# Patient Record
Sex: Female | Born: 1945
Health system: Southern US, Community
[De-identification: ages and names within clinical notes are randomized; demographics above are authoritative.]

## PROBLEM LIST (undated history)

## (undated) DIAGNOSIS — Z972 Presence of dental prosthetic device (complete) (partial): Secondary | ICD-10-CM

## (undated) DIAGNOSIS — R52 Pain, unspecified: Secondary | ICD-10-CM

## (undated) DIAGNOSIS — C801 Malignant (primary) neoplasm, unspecified: Secondary | ICD-10-CM

## (undated) DIAGNOSIS — I10 Essential (primary) hypertension: Secondary | ICD-10-CM

## (undated) DIAGNOSIS — M1990 Unspecified osteoarthritis, unspecified site: Secondary | ICD-10-CM

## (undated) DIAGNOSIS — F32A Depression, unspecified: Secondary | ICD-10-CM

## (undated) DIAGNOSIS — R7303 Prediabetes: Secondary | ICD-10-CM

## (undated) DIAGNOSIS — I739 Peripheral vascular disease, unspecified: Secondary | ICD-10-CM

## (undated) DIAGNOSIS — F329 Major depressive disorder, single episode, unspecified: Secondary | ICD-10-CM

## (undated) DIAGNOSIS — E785 Hyperlipidemia, unspecified: Secondary | ICD-10-CM

## (undated) DIAGNOSIS — E119 Type 2 diabetes mellitus without complications: Secondary | ICD-10-CM

## (undated) HISTORY — PX: SKIN CANCER EXCISION: SHX779

## (undated) HISTORY — PX: BREAST CYST ASPIRATION: SHX578

## (undated) HISTORY — DX: Type 2 diabetes mellitus without complications: E11.9

## (undated) HISTORY — DX: Major depressive disorder, single episode, unspecified: F32.9

## (undated) HISTORY — DX: Pain, unspecified: R52

## (undated) HISTORY — DX: Malignant (primary) neoplasm, unspecified: C80.1

## (undated) HISTORY — PX: FRACTURE SURGERY: SHX138

## (undated) HISTORY — DX: Hyperlipidemia, unspecified: E78.5

## (undated) HISTORY — DX: Essential (primary) hypertension: I10

## (undated) HISTORY — DX: Depression, unspecified: F32.A

---

## 2009-07-22 ENCOUNTER — Other Ambulatory Visit: Payer: Self-pay | Admitting: Family Medicine

## 2009-07-25 ENCOUNTER — Ambulatory Visit: Payer: Self-pay | Admitting: Family Medicine

## 2010-09-16 ENCOUNTER — Ambulatory Visit: Payer: Self-pay | Admitting: Family Medicine

## 2010-11-20 ENCOUNTER — Other Ambulatory Visit: Payer: Self-pay | Admitting: Family Medicine

## 2011-02-13 ENCOUNTER — Ambulatory Visit: Payer: Self-pay | Admitting: Unknown Physician Specialty

## 2011-02-16 LAB — PATHOLOGY REPORT

## 2011-10-14 ENCOUNTER — Ambulatory Visit: Payer: Self-pay | Admitting: Family Medicine

## 2012-03-07 ENCOUNTER — Ambulatory Visit: Payer: Self-pay | Admitting: General Practice

## 2012-05-31 ENCOUNTER — Ambulatory Visit: Payer: Self-pay | Admitting: General Practice

## 2012-08-02 HISTORY — PX: LASER ABLATION: SHX1947

## 2012-08-16 ENCOUNTER — Encounter: Payer: Self-pay | Admitting: Nurse Practitioner

## 2012-08-16 ENCOUNTER — Encounter: Payer: Self-pay | Admitting: Cardiothoracic Surgery

## 2012-09-02 ENCOUNTER — Encounter: Payer: Self-pay | Admitting: Cardiothoracic Surgery

## 2012-09-02 ENCOUNTER — Encounter: Payer: Self-pay | Admitting: Nurse Practitioner

## 2012-10-02 ENCOUNTER — Encounter: Payer: Self-pay | Admitting: Cardiothoracic Surgery

## 2012-10-02 ENCOUNTER — Encounter: Payer: Self-pay | Admitting: Nurse Practitioner

## 2012-11-02 DIAGNOSIS — E785 Hyperlipidemia, unspecified: Secondary | ICD-10-CM

## 2012-11-02 HISTORY — DX: Hyperlipidemia, unspecified: E78.5

## 2012-11-04 ENCOUNTER — Ambulatory Visit: Payer: Self-pay | Admitting: Family Medicine

## 2012-12-26 ENCOUNTER — Other Ambulatory Visit: Payer: Self-pay | Admitting: Family Medicine

## 2012-12-26 LAB — COMPREHENSIVE METABOLIC PANEL
Alkaline Phosphatase: 65 U/L (ref 50–136)
Anion Gap: 6 — ABNORMAL LOW (ref 7–16)
Bilirubin,Total: 0.8 mg/dL (ref 0.2–1.0)
Calcium, Total: 8.7 mg/dL (ref 8.5–10.1)
Creatinine: 0.62 mg/dL (ref 0.60–1.30)
EGFR (African American): >60
EGFR (Non-African Amer.): >60
Glucose: 115 mg/dL — ABNORMAL HIGH (ref 65–99)
Potassium: 3.6 mmol/L (ref 3.5–5.1)
SGPT (ALT): 10 U/L — ABNORMAL LOW (ref 12–78)
Total Protein: 7.5 g/dL (ref 6.4–8.2)

## 2012-12-26 LAB — CBC WITH DIFFERENTIAL/PLATELET
HCT: 38.4 % (ref 35.0–47.0)
Lymphocyte #: 1.5 10*3/uL (ref 1.0–3.6)
Lymphocyte %: 27.5 %
MCHC: 33.2 g/dL (ref 32.0–36.0)
Neutrophil #: 3.4 10*3/uL (ref 1.4–6.5)
RBC: 4.26 10*6/uL (ref 3.80–5.20)
RDW: 14.9 % — ABNORMAL HIGH (ref 11.5–14.5)
WBC: 5.6 10*3/uL (ref 3.6–11.0)

## 2012-12-26 LAB — URINALYSIS, COMPLETE
Bacteria: NONE SEEN
Bilirubin,UR: NEGATIVE
Blood: NEGATIVE
Glucose,UR: NEGATIVE mg/dL (ref 0–75)
Ketone: NEGATIVE
Nitrite: NEGATIVE
Ph: 6 (ref 4.5–8.0)
Protein: NEGATIVE
RBC,UR: 1 /HPF (ref 0–5)
Specific Gravity: 1.018 (ref 1.003–1.030)
WBC UR: 1 /HPF (ref 0–5)

## 2013-06-01 ENCOUNTER — Ambulatory Visit: Payer: Self-pay | Admitting: Family Medicine

## 2013-06-02 ENCOUNTER — Ambulatory Visit: Payer: Self-pay | Admitting: Family Medicine

## 2013-07-03 ENCOUNTER — Ambulatory Visit: Payer: Self-pay | Admitting: Family Medicine

## 2013-08-02 ENCOUNTER — Ambulatory Visit: Payer: Self-pay | Admitting: Family Medicine

## 2013-08-07 ENCOUNTER — Other Ambulatory Visit: Payer: Self-pay | Admitting: Family Medicine

## 2013-11-09 ENCOUNTER — Ambulatory Visit: Payer: Self-pay | Admitting: Family Medicine

## 2013-11-20 ENCOUNTER — Ambulatory Visit: Payer: Self-pay | Admitting: Family Medicine

## 2013-11-24 ENCOUNTER — Ambulatory Visit: Payer: Self-pay | Admitting: Family Medicine

## 2013-11-27 LAB — PATHOLOGY REPORT

## 2014-04-06 ENCOUNTER — Ambulatory Visit: Payer: Self-pay | Admitting: Unknown Physician Specialty

## 2014-04-11 LAB — PATHOLOGY REPORT

## 2014-05-18 DIAGNOSIS — M179 Osteoarthritis of knee, unspecified: Secondary | ICD-10-CM | POA: Insufficient documentation

## 2014-05-18 DIAGNOSIS — M1710 Unilateral primary osteoarthritis, unspecified knee: Secondary | ICD-10-CM | POA: Insufficient documentation

## 2014-11-20 ENCOUNTER — Ambulatory Visit: Payer: Self-pay | Admitting: Family Medicine

## 2015-07-17 ENCOUNTER — Other Ambulatory Visit: Payer: Self-pay

## 2015-07-17 ENCOUNTER — Ambulatory Visit: Payer: Self-pay

## 2015-07-17 NOTE — Patient Outreach (Signed)
[facility]) Care Management  07/17/2015  Kendra Downs June 16, 1946 [id number]  [name] did not show for her scheduled appointment.  I will call her to reschedule.   Gentry Fitz, RN, BA, [place], [place] Direct Dial:  [phone]  Fax:  [phone] E-mail: [email or web address] [address] [place], [place]  [address]

## 2015-07-17 NOTE — Patient Outreach (Signed)
[facility]) Care Management  07/17/2015  Ainsleigh Kakos [birth year] [id number]  I called to reschedule Sherian's visit that she missed today.  Left a message for her to return my call.   Gentry Fitz, RN, BA, [place], [place] Direct Dial:  [phone]  Fax:  [phone] E-mail: [email or web address] [address] [place], [place]  [address]

## 2015-08-12 ENCOUNTER — Other Ambulatory Visit: Payer: Self-pay

## 2015-08-12 VITALS — BP 142/72 | Ht 65.0 in | Wt 210.5 lb

## 2015-08-12 DIAGNOSIS — E119 Type 2 diabetes mellitus without complications: Secondary | ICD-10-CM

## 2015-08-12 NOTE — Patient Outreach (Signed)
[facility]) Care Management  [place]  08/12/2015   Kendra Downs [birth year] [id number]  Subjective: Patient in for her regularly scheduled Link to Wellness visit.  She spent over an hour discussing recent loses in her family (her dog, her brother, a sister in law).  She also discussed her feeling of loss when her daughter in law revealed that she has begun to date (Xin's only son was married to her- he died in Feb 07, 2012).  She has been waking up through the middle of the night in tears and was crying in my office.  She stopped taking her Celexa in early 2015-02-07 when she was feeling better but we discussed her starting it again.  Verbalizes being raised to "grin and bear it" , to not show weakness, and to avoid talking about feeling depressed. She reports having a cousin who she can talk to when she's feeling down.    Objective: Weeping through much of our appointment discussing her son. Brought in her blood sugar records which have crept up from 104-139 mg/dl fasting in March to 120-158 mg/dl fasting now- she's checking sugars less often.  She is not exercising.  She has gained 10lbs since March, 2016.  Current Medications:  Current Outpatient Prescriptions  Medication Sig Dispense Refill  . etodolac (LODINE) 500 MG tablet Take by mouth.    Marland Kitchen glucose blood (ONE TOUCH ULTRA TEST) test strip Use 1 strip via meter once a day as directed    . hydrochlorothiazide (MICROZIDE) 12.5 MG capsule TAKE ONE CAPSULE BY MOUTH DAILY    . quinapril-hydrochlorothiazide (ACCURETIC) 20-12.5 MG tablet Take by mouth.    . Vitamin D, Ergocalciferol, (DRISDOL) 50000 UNITS CAPS capsule TAKE 1 CAPSULE BY MOUTH ONCE A WEEK     No current facility-administered medications for this visit.    Functional Status:  In your present state of health, do you have any difficulty performing the following activities: 08/12/2015  Hearing? N  Vision? N  Difficulty concentrating or making  decisions? N  Walking or climbing stairs? N  Dressing or bathing? N  Doing errands, shopping? N  Preparing Food and eating ? N  Using the Toilet? N   Assessment: A1C remains ideal (6.2%)  but blood sugars continue to climb.   Reports feeling down and depressed most days of the week.  No falls in the past year.  Plan: She still has Celexa at home but would need refills if she decides- I have asked her to call you if she decides she wants to restart it.   THN CM Care Plan Problem One        Most Recent Value   Care Plan Problem One  Potential for elevated blood sugars   Role Documenting the Problem One  Care Management Coordinator   Care Plan for Problem One  Active   THN Long Term Goal (31-90 days)  Patient will maintain an A1C of less than 6.5%   THN Long Term Goal Start Date  08/12/15   Interventions for Problem One Long Term Goal  1. Continue to check blood sugars most days of the week- document   2. limit meals to 1 plate and follow the plate method for chosing a meal   3. consider calling hospice about the support group for peopl e who have lost their children  4. Exercise 3x/week for 15 minutes  5. talk to MD about restarting Celexa 6. Get a dilated eye exam     I  will follow up with Lanore in 3 months.  Please consider bringing her into your office or calling her at home to discuss her depression/ starting Celexa again.      Gentry Fitz, RN, BA, [place], [place] Direct Dial:  [phone]  Fax:  [phone] E-mail: [email or web address] [address], [place], [place]  [address]

## 2015-08-21 ENCOUNTER — Other Ambulatory Visit: Payer: Self-pay

## 2015-08-21 NOTE — Patient Outreach (Signed)
[facility]) Care Management  08/21/2015  Kendra Downs [birth year] [id number]   Updated medical chart via phone today with [name].  She's feeling well; less depressed- spent the weekend with her cousin discussing recent visit with me and her feelings of loss.  Her cousin recently lost her partner and can empathize with [name].  She sounds very supportive.   Follow up in January, 2017   Gentry Fitz, RN, [place], [place], [place]:  [phone]  Fax:  [phone] E-mail: [email or web address] [address] [place], [place]  [address]

## 2015-11-13 ENCOUNTER — Ambulatory Visit: Payer: 59

## 2015-11-27 ENCOUNTER — Other Ambulatory Visit: Payer: Self-pay

## 2015-11-27 VITALS — BP 140/70 | Ht 65.0 in | Wt 209.9 lb

## 2015-11-27 DIAGNOSIS — E119 Type 2 diabetes mellitus without complications: Secondary | ICD-10-CM

## 2015-11-27 NOTE — Patient Outreach (Signed)
Kendra Downs [facility]) Care Management  [place]  11/27/2015   Kendra Downs 1946-09-08 [id number]  Subjective: Patient in for Link to Wellness diabetes visit. She spoke briefly today about her son, her daughter in law but denies feeling down or depressed- she did not reach out to the MD to get Celexa restarted despite our long discussion and her emotional distress at our last visit.  She talked openly about her daughter in law dating.  She has been checking blood sugars most days and her average morning blood sugars are 97-136mg /dl.  She has maintained her weight over the holidays.  Objective:  Filed Vitals:   11/27/15 1501  BP: 140/70  Height: 1.651 m (5\' 5" )  Weight: 209 lb 14.4 oz (95.21 kg)     Current Medications:  Current Outpatient Prescriptions  Medication Sig Dispense Refill  . etodolac (LODINE) 500 MG tablet Take by mouth.    . hydrochlorothiazide (MICROZIDE) 12.5 MG capsule TAKE ONE CAPSULE BY MOUTH DAILY    . quinapril-hydrochlorothiazide (ACCURETIC) 20-12.5 MG tablet Take by mouth.    . Vitamin D, Ergocalciferol, (DRISDOL) 50000 UNITS CAPS capsule TAKE 1 CAPSULE BY MOUTH ONCE A WEEK    . glucose blood (ONE TOUCH ULTRA TEST) test strip Reported on 11/27/2015     No current facility-administered medications for this visit.    Functional Status:  In your present state of health, do you have any difficulty performing the following activities: 11/27/2015 08/12/2015  Hearing? N N  Vision? N N  Difficulty concentrating or making decisions? N N  Walking or climbing stairs? Y N  Dressing or bathing? N N  Doing errands, shopping? N N  Preparing Food and eating ? - N  Using the Toilet? - N    Fall/Depression Screening: PHQ 2/9 Scores 11/27/2015 08/21/2015 08/12/2015  PHQ - 2 Score 0 4 4  PHQ- 9 Score - 6 6    Assessment: Kendra Downs appears to be doing better emotionally (not crying during our appointment) but tells me she's had some difficulty at  work getting frustrated with some of the hospital policies/employees.  She feeling optimistic about starting 1st shift and thinks she'll be able to start exercising if she does change shifts.  She did not call Hospice for a support group, did not start exercising and did not call MD to discuss Celexa despite setting these goals at our last visit.   Plan: Kendra Downs plans to join the [facility] gym and exercise 2x/week starting with 15 minutes each time.  THN CM Care Plan Problem One        Most Recent Value   Care Plan Problem One  Potential for elevated blood sugars   Role Documenting the Problem One  Care Management Coordinator   Care Plan for Problem One  Active   THN Long Term Goal (31-90 days)  Patient will maintain an A1C of less than 6.5%   THN Long Term Goal Start Date  08/12/15   Interventions for Problem One Long Term Goal  1. Continue to check blood sugars most days of the week- document   2. limit meals to 1 plate and follow the plate method for chosing a meal        The plan is to see Kendra Downs in 6 months.   Gentry Fitz, RN, BA, [place], [place] Direct Dial:  [phone]  Fax:  [phone] E-mail: [email or web address] [address] [place], [place]  [address]

## 2015-12-31 ENCOUNTER — Other Ambulatory Visit: Payer: Self-pay | Admitting: Family Medicine

## 2015-12-31 DIAGNOSIS — N6310 Unspecified lump in the right breast, unspecified quadrant: Secondary | ICD-10-CM

## 2016-01-02 ENCOUNTER — Ambulatory Visit: Payer: 59 | Attending: Family Medicine

## 2016-01-02 DIAGNOSIS — M25511 Pain in right shoulder: Secondary | ICD-10-CM

## 2016-01-02 NOTE — Therapy (Signed)
[place] MAIN [facility] SERVICES [address] [place], [place], [address] Phone: [phone]   Fax:  [phone]  Physical Therapy Treatment  Patient Details  Name: Kendra Downs MRN: [id number] Date of Birth: Mar 30, 1946 No Data Recorded  Encounter Date: 01/02/2016    Past Medical History  Diagnosis Date  . Cancer ([place])     right arm - skin  . Hypertension   . Depression   . Diabetes mellitus without complication ([place])   . Pain     right knee  . Hyperlipidemia 2014    Past Surgical History  Procedure Laterality Date  . Laser ablation  october 2013    venous ulcer    There were no vitals filed for this visit.  Visit Diagnosis:  Pain in joint of right shoulder        PT/OT/SLP Screening Form   Time: [id number]______     Time out_____   Complaint _L shoulder pain__________________ Past Medical Hx:  ___HTN, OA _____________ Injury Date:__n/a______________________________  Pain Scale: __ __________ Patient's phone number:   Hx (this occurrence):  Pt reports 1.5 weeks having R shoulder pain especially when shes sleeping. Pt reports mild R pain at rest and more pain when moving the arm. Pt denies any neck pain or numbness tingling. Pt denies any injury or falls. Pt works in [employer] as a Scientist, water quality primarily. Pt has taken some NSAIDs.     Assessment: Pt is tender over RTC insertion, R biceps tendon and infraspinatus muscle belly.  + painful arch + H-K test Pt has full ROM but painful  Cervical screen negative   Recommendations:   Ice, limit overhead activity , posture correction  Comments:    []  Patient would benefit from an MD referral [x]  Patient would benefit from a full PT/OT/ SLP evaluation and treatment. []  No intervention recommended at this time.    [place] Harms. Marley Pakula, PT, DPT [phone]                                     Problem List Patient Active Problem  List   Diagnosis Date Noted  . Diabetes type 2, controlled ([place]) 08/12/2015  . Arthritis of knee, degenerative 05/18/2014   [place] Harms. Loraine Bhullar, PT, DPT [phone]  Chazz Philson 01/02/2016, 2:38 PM  [place] MAIN [facility] SERVICES [address] [place], [place], [address] Phone: [phone]   Fax:  [phone]  Name: Kendra Downs MRN: [id number] Date of Birth: 06-28-1946

## 2016-01-08 DIAGNOSIS — M25511 Pain in right shoulder: Secondary | ICD-10-CM | POA: Diagnosis not present

## 2016-01-08 DIAGNOSIS — M12811 Other specific arthropathies, not elsewhere classified, right shoulder: Secondary | ICD-10-CM | POA: Diagnosis not present

## 2016-01-08 DIAGNOSIS — M19011 Primary osteoarthritis, right shoulder: Secondary | ICD-10-CM | POA: Diagnosis not present

## 2016-01-10 ENCOUNTER — Other Ambulatory Visit: Payer: 59

## 2016-01-10 ENCOUNTER — Ambulatory Visit: Payer: 59

## 2016-01-15 ENCOUNTER — Ambulatory Visit
Admission: RE | Admit: 2016-01-15 | Discharge: 2016-01-15 | Disposition: A | Discharge status: Home / Self Care | Payer: 59 | Source: Ambulatory Visit | Attending: Family Medicine | Admitting: Family Medicine

## 2016-01-15 ENCOUNTER — Other Ambulatory Visit: Payer: Self-pay | Admitting: Family Medicine

## 2016-01-15 DIAGNOSIS — N6310 Unspecified lump in the right breast, unspecified quadrant: Secondary | ICD-10-CM

## 2016-01-15 DIAGNOSIS — N63 Unspecified lump in breast: Secondary | ICD-10-CM | POA: Insufficient documentation

## 2016-01-24 ENCOUNTER — Encounter: Payer: Self-pay | Admitting: Physician Assistant

## 2016-01-24 ENCOUNTER — Ambulatory Visit: Payer: Self-pay | Admitting: Physician Assistant

## 2016-01-24 VITALS — BP 140/80 | HR 78 | Temp 97.9°F

## 2016-01-24 DIAGNOSIS — J209 Acute bronchitis, unspecified: Secondary | ICD-10-CM

## 2016-01-24 MED ORDER — PSEUDOEPH-BROMPHEN-DM 30-2-10 MG/5ML PO SYRP: 5.0000 mL | ORAL_SOLUTION | Freq: Four times a day (QID) | ORAL | Status: DC | PRN

## 2016-01-24 MED ORDER — AZITHROMYCIN 250 MG PO TABS: ORAL_TABLET | ORAL | Status: DC

## 2016-01-24 NOTE — Progress Notes (Signed)
   Subjective:cough/congestion    Patient ID: Kendra Downs, female    DOB: 08-May-1946, 70 y.o.   MRN: [id number]  HPI Patient c/o 2 days worsening cough and chest congestion. Cough increase with laying down.  Cough is productive and notice wheezing. Denies fever/chills, or N/V/D. No palliative measure for compliant.   Review of Systems Diabetes and Hypertension    Objective:   Physical Exam No acute distress. VSS. HEENT for edematous nasal turbinates. Post nasal drainage.  Neck supple without adenopathy. Lungs with mild wheezing and Rales. Heart RRR.       Assessment & Plan:  Bronchitis Zithromax and Bromfed DM.  Follow up 3 days if no improvement.

## 2016-04-29 DIAGNOSIS — Z8639 Personal history of other endocrine, nutritional and metabolic disease: Secondary | ICD-10-CM | POA: Diagnosis not present

## 2016-04-29 DIAGNOSIS — E7800 Pure hypercholesterolemia, unspecified: Secondary | ICD-10-CM | POA: Diagnosis not present

## 2016-04-29 DIAGNOSIS — R7309 Other abnormal glucose: Secondary | ICD-10-CM | POA: Diagnosis not present

## 2016-05-11 ENCOUNTER — Ambulatory Visit: Payer: Self-pay

## 2016-05-20 DIAGNOSIS — M19072 Primary osteoarthritis, left ankle and foot: Secondary | ICD-10-CM | POA: Diagnosis not present

## 2016-05-20 DIAGNOSIS — S93432A Sprain of tibiofibular ligament of left ankle, initial encounter: Secondary | ICD-10-CM | POA: Diagnosis not present

## 2016-05-27 ENCOUNTER — Other Ambulatory Visit: Payer: Self-pay

## 2016-05-27 VITALS — BP 134/72 | Ht 65.0 in | Wt 213.7 lb

## 2016-05-27 DIAGNOSIS — E119 Type 2 diabetes mellitus without complications: Secondary | ICD-10-CM

## 2016-06-03 NOTE — Patient Outreach (Signed)
[facility] [place]) Care Management  [place]  05/27/16   Jasdeep Dejarnett 1945-12-15 [id number]  Subjective: Met with Viridiana for her routine Link to Wellness diabetes visit. She brought her written blood sugars to the visit (not her meter).  She is checking fasting blood sugars only and they range from 97-133m/dl.  She denies hypo or hyperglycemia. She is not exercising and is following a diabetic diet about 50-75% of the time. She does not go to the dentist because she wears dentures and her last dilated eye exam was on 08/27/15.  Objective:  Vitals:   05/27/16 1429  BP: 134/72  Weight: 213 lb 11.2 oz (96.9 kg)  Height: 1.651 m (_0 )     Encounter Medications:  Outpatient Encounter Prescriptions as of 05/27/2016  Medication Sig Note  . etodolac (LODINE) 500 MG tablet Take by mouth. 08/12/2015: [place]: [facility] . glucose blood (ONE TOUCH ULTRA TEST) test strip Reported on 11/27/2015 08/12/2015: [place] from: [place] Use 1 strip via meter once a day as directed  . hydrochlorothiazide (MICROZIDE) 12.5 MG capsule TAKE ONE CAPSULE BY MOUTH DAILY 08/12/2015: [facility] . quinapril-hydrochlorothiazide (ACCURETIC) 20-12.5 MG tablet Take by mouth. 08/12/2015: [place]: [place] . Vitamin D, Ergocalciferol, (DRISDOL) 50000 UNITS CAPS capsule TAKE 1 CAPSULE BY MOUTH ONCE A WEEK 08/12/2015: [facility] . azithromycin (ZITHROMAX) 250 MG tablet Take 2 tablets today; then one tablet daily. (Patient not taking: Reported on 05/27/2016)   . brompheniramine-pseudoephedrine-DM 30-2-10 MG/5ML syrup Take 5 mLs by mouth 4 (four) times daily as needed. (Patient not taking: Reported on 05/27/2016)    No facility-administered encounter medications on file as of 05/27/2016.     Functional Status:  In your present state of  health, do you have any difficulty performing the following activities: 11/27/2015 08/12/2015  Hearing? N N  Vision? N N  Difficulty concentrating or making decisions? N N  Walking or climbing stairs? Y N  Dressing or bathing? N N  Doing errands, shopping? N N  Preparing Food and eating ? - N  Using the Toilet? - N  Some recent data might be hidden    Fall/Depression Screening: PHQ 2/9 Scores 05/27/2016 11/27/2015 08/21/2015 08/12/2015  PHQ - 2 Score 1 0 4 4  PHQ- 9 Score - - 6 6    Assessment: PKahlieis checking blood sugars and trying to eat well but is limited in exercise.  She complains that she had pain in her left ankle last week and was given a splint to wear on the ankle- it is present on the left ankle at our visit- she denies having pain now that she is wearing the brace. We reviewed the symptoms of a heart attack and a stroke and the acronym FAST to remember the stroke symptoms.  She is/has not had any of these symptoms.   Plan:  [facility]   Flowsheet Row Most Recent Value  Care Plan Problem One  Potential for elevated blood sugars  Role Documenting the Problem One  Care Management Coordinator  Care Plan for Problem One  Active  THN Long Term Goal (31-90 days)  Patient will maintain an A1C of less than 6.5% when it is checked next by MD or [facility] Start Date  05/27/16  [facility] Goal Met Date  -- [  met on 7/26- reinstate goal]  Interventions for Problem One Long Term Goal  1. discussed need to check CBG 2 hours post prandial some days 2. discussed drinking alcohol with food to prevent hypoglycemia 3. reviewed the diabetic diet 4. reviewed the need for dilated eye and MD to do an oral cancer screen since she does not go to the dentist 5. discussed the importance of checking her feet daily (especially since she is now wearing a brace.      I will follow up with Larue in January 2018.   Gentry Fitz, RN, BA, [place], [place] Direct Dial:  [phone]  Fax:  [phone] E-mail: [email or web address] [address], [place], [place]  [address]

## 2016-06-29 DIAGNOSIS — Z8739 Personal history of other diseases of the musculoskeletal system and connective tissue: Secondary | ICD-10-CM | POA: Diagnosis not present

## 2016-07-27 DIAGNOSIS — E784 Other hyperlipidemia: Secondary | ICD-10-CM | POA: Diagnosis not present

## 2016-07-27 DIAGNOSIS — E559 Vitamin D deficiency, unspecified: Secondary | ICD-10-CM | POA: Diagnosis not present

## 2016-07-27 DIAGNOSIS — I10 Essential (primary) hypertension: Secondary | ICD-10-CM | POA: Diagnosis not present

## 2016-08-10 DIAGNOSIS — M1711 Unilateral primary osteoarthritis, right knee: Secondary | ICD-10-CM | POA: Diagnosis not present

## 2016-08-11 DIAGNOSIS — I10 Essential (primary) hypertension: Secondary | ICD-10-CM | POA: Diagnosis not present

## 2016-08-11 DIAGNOSIS — E785 Hyperlipidemia, unspecified: Secondary | ICD-10-CM | POA: Diagnosis not present

## 2016-08-11 DIAGNOSIS — Z0000 Encounter for general adult medical examination without abnormal findings: Secondary | ICD-10-CM | POA: Diagnosis not present

## 2016-08-11 DIAGNOSIS — R739 Hyperglycemia, unspecified: Secondary | ICD-10-CM | POA: Diagnosis not present

## 2016-09-03 DIAGNOSIS — Z85828 Personal history of other malignant neoplasm of skin: Secondary | ICD-10-CM | POA: Diagnosis not present

## 2016-09-03 DIAGNOSIS — D2272 Melanocytic nevi of left lower limb, including hip: Secondary | ICD-10-CM | POA: Diagnosis not present

## 2016-09-03 DIAGNOSIS — D2261 Melanocytic nevi of right upper limb, including shoulder: Secondary | ICD-10-CM | POA: Diagnosis not present

## 2016-09-03 DIAGNOSIS — D225 Melanocytic nevi of trunk: Secondary | ICD-10-CM | POA: Diagnosis not present

## 2016-09-16 ENCOUNTER — Encounter: Payer: Self-pay | Admitting: Physical Therapy

## 2016-09-16 ENCOUNTER — Ambulatory Visit: Payer: 59 | Attending: Orthopedic Surgery | Admitting: Physical Therapy

## 2016-09-16 DIAGNOSIS — M25561 Pain in right knee: Secondary | ICD-10-CM | POA: Diagnosis not present

## 2016-09-16 NOTE — Therapy (Signed)
[place] MAIN [facility] SERVICES [address] [place], [place], [address] Phone: [phone]   Fax:  [phone]  Physical Therapy Treatment  Patient Details  Name: Kendra Downs MRN: [id number] Date of Birth: 1946-02-25 Referring Provider: Duanne Guess  Encounter Date: 09/16/2016      PT End of Session - 09/16/16 1423    Visit Number 1   Date for PT Re-Evaluation 10/14/16      Past Medical History:  Diagnosis Date  . Cancer ([place])    right arm - skin  . Depression   . Diabetes mellitus without complication ([place])   . Hyperlipidemia 2014  . Hypertension   . Pain    right knee    Past Surgical History:  Procedure Laterality Date  . BREAST CYST ASPIRATION Right    cyst  . LASER ABLATION  october 2013   venous ulcer    There were no vitals filed for this visit.      Subjective Assessment - 09/16/16 1413    Subjective Patient is having R knee pain and wants to get stronger.   Pertinent History cortisone shots x 2, sinvistis R knee   Limitations Sitting;Standing;Walking   How long can you sit comfortably? unlimited   How long can you stand comfortably? 7 hours / day   How long can you walk comfortably? 20 minutes   Patient Stated Goals to be able to walk longer and wihtout pain    Currently in Pain? Yes   Pain Score 3    Pain Location Knee   Pain Orientation Right   Pain Descriptors / Indicators Aching   Pain Type Chronic pain   Pain Onset More than a month ago   Pain Frequency Intermittent   Aggravating Factors  standing   Pain Relieving Factors medicine   Effect of Pain on Daily Activities increases pain   Multiple Pain Sites No            [facility] PT Assessment - 09/16/16 0001      Assessment   Medical Diagnosis R knee pain   Referring Provider Dorise Hiss C   Onset Date/Surgical Date 08/10/16   Hand Dominance Right     Precautions   Precautions None     Restrictions   Weight Bearing  Restrictions No     Balance Screen   Has the patient fallen in the past 6 months No   Has the patient had a decrease in activity level because of a fear of falling?  Yes   Is the patient reluctant to leave their home because of a fear of falling?  No     Home Environment   Living Environment Private residence   Available Help at Discharge Family   Type of Walker to enter   Entrance Stairs-Number of Steps [place] One level   [place] - 2 wheels     Prior Function   Level of Independence Independent   Vocation Full time employment   Vocation Requirements standing     Cognition   Overall Cognitive Status Within Functional Limits for tasks assessed   Attention Focused       PAIN: 5/10  POSTURE: WNL   PROM/AROM: PROM 0- 90 deg   STRENGTH:  Graded on a 0-5 scale Muscle Group Left Right  Hip Flex 5/5   Hip Abd 5/5   Hip Add 5/5   Hip Ext    Hip IR/ER 5/5   Knee Flex 5/5   Knee Ext 5/5   Ankle DF 5/5   Ankle PF 5/5    SENSATION: WNL   FUNCTIONAL MOBILITY:independent   BALANCE: WFL evidenced by single leg stand   GAIT: Ambulates without AD with slow gait speed   OUTCOME MEASURES: TEST Outcome Interpretation  5 times sit<>stand 22.85 sec >72 yo, >15 sec indicates increased risk for falls  10 meter walk test     . 94            m/s <1.0 m/s indicates increased risk for falls; limited community ambulator  Timed up and Go    10.77             sec <72 sec indicates increased risk for falls                                       PT Education - 09/16/16 1422    Education provided Yes   Education Details HEP and plan of care   Person(s) Educated Patient   Methods Explanation   Comprehension Verbalized understanding                  Patient will benefit from skilled therapeutic intervention in order to improve the  following deficits and impairments:     Visit Diagnosis: Right knee pain, unspecified chronicity     Problem List Patient Active Problem List   Diagnosis Date Noted  . Diabetes type 2, controlled ([place]) 08/12/2015  . Arthritis of knee, degenerative 05/18/2014   Alanson Puls, PT, DPT [place], [place] S 09/16/2016, 2:24 PM  [place] MAIN [facility] SERVICES [address] [place], [place], [address] Phone: [phone]   Fax:  [phone]  Name: Kendra Downs MRN: [id number] Date of Birth: [birth year]

## 2016-09-16 NOTE — Patient Instructions (Signed)
g

## 2016-09-21 ENCOUNTER — Ambulatory Visit: Payer: 59 | Admitting: Physical Therapy

## 2016-09-22 DIAGNOSIS — M1711 Unilateral primary osteoarthritis, right knee: Secondary | ICD-10-CM | POA: Diagnosis not present

## 2016-09-22 DIAGNOSIS — M25561 Pain in right knee: Secondary | ICD-10-CM | POA: Diagnosis not present

## 2016-09-22 DIAGNOSIS — G8929 Other chronic pain: Secondary | ICD-10-CM | POA: Diagnosis not present

## 2016-09-23 ENCOUNTER — Encounter: Payer: 59 | Admitting: Physical Therapy

## 2016-09-28 ENCOUNTER — Encounter: Payer: 59 | Admitting: Physical Therapy

## 2016-09-30 ENCOUNTER — Ambulatory Visit: Payer: 59 | Admitting: Physical Therapy

## 2016-10-06 ENCOUNTER — Encounter: Payer: Medicare Other | Admitting: Physical Therapy

## 2016-10-08 ENCOUNTER — Ambulatory Visit: Payer: 59 | Admitting: Physical Therapy

## 2016-10-13 ENCOUNTER — Encounter: Payer: Medicare Other | Admitting: Physical Therapy

## 2016-10-15 ENCOUNTER — Encounter: Payer: Self-pay | Admitting: Physical Therapy

## 2016-10-20 ENCOUNTER — Encounter: Payer: Self-pay | Admitting: Physical Therapy

## 2016-10-22 ENCOUNTER — Encounter: Payer: Self-pay | Admitting: Physical Therapy

## 2016-10-28 ENCOUNTER — Encounter
Admission: RE | Admit: 2016-10-28 | Discharge: 2016-10-28 | Disposition: A | Discharge status: Home / Self Care | Payer: 59 | Source: Ambulatory Visit | Attending: Orthopedic Surgery | Admitting: Orthopedic Surgery

## 2016-10-28 DIAGNOSIS — Z01812 Encounter for preprocedural laboratory examination: Secondary | ICD-10-CM | POA: Diagnosis not present

## 2016-10-28 DIAGNOSIS — R001 Bradycardia, unspecified: Secondary | ICD-10-CM | POA: Diagnosis not present

## 2016-10-28 DIAGNOSIS — M1711 Unilateral primary osteoarthritis, right knee: Secondary | ICD-10-CM | POA: Diagnosis not present

## 2016-10-28 DIAGNOSIS — R9431 Abnormal electrocardiogram [ECG] [EKG]: Secondary | ICD-10-CM | POA: Insufficient documentation

## 2016-10-28 DIAGNOSIS — I10 Essential (primary) hypertension: Secondary | ICD-10-CM | POA: Insufficient documentation

## 2016-10-28 HISTORY — DX: Peripheral vascular disease, unspecified: I73.9

## 2016-10-28 HISTORY — DX: Unspecified osteoarthritis, unspecified site: M19.90

## 2016-10-28 LAB — COMPREHENSIVE METABOLIC PANEL
ALT: 10 U/L — ABNORMAL LOW (ref 14–54)
ANION GAP: 9 (ref 5–15)
AST: 23 U/L (ref 15–41)
Albumin: 4.1 g/dL (ref 3.5–5.0)
Alkaline Phosphatase: 49 U/L (ref 38–126)
BILIRUBIN TOTAL: 1.2 mg/dL (ref 0.3–1.2)
BUN: 20 mg/dL (ref 6–20)
CO2: 27 mmol/L (ref 22–32)
Calcium: 9.1 mg/dL (ref 8.9–10.3)
Chloride: 104 mmol/L (ref 101–111)
Creatinine, Ser: 0.65 mg/dL (ref 0.44–1.00)
GFR calc Af Amer: >60 mL/min (ref >60)
Glucose, Bld: 108 mg/dL — ABNORMAL HIGH (ref 65–99)
POTASSIUM: 3.8 mmol/L (ref 3.5–5.1)
Sodium: 140 mmol/L (ref 135–145)
TOTAL PROTEIN: 7.1 g/dL (ref 6.5–8.1)

## 2016-10-28 LAB — TYPE AND SCREEN
ABO/RH(D): O POS
Antibody Screen: NEGATIVE

## 2016-10-28 LAB — SURGICAL PCR SCREEN
MRSA, PCR: NEGATIVE
Staphylococcus aureus: NEGATIVE

## 2016-10-28 LAB — PROTIME-INR
INR: 1.04
PROTHROMBIN TIME: 13.6 s (ref 11.4–15.2)

## 2016-10-28 LAB — CBC
HEMATOCRIT: 38.2 % (ref 35.0–47.0)
Hemoglobin: 12.9 g/dL (ref 12.0–16.0)
MCH: 30.7 pg (ref 26.0–34.0)
MCHC: 33.7 g/dL (ref 32.0–36.0)
MCV: 91.1 fL (ref 80.0–100.0)
PLATELETS: 207 10*3/uL (ref 150–440)
RBC: 4.2 MIL/uL (ref 3.80–5.20)
RDW: 15.3 % — AB (ref 11.5–14.5)
WBC: 5.5 10*3/uL (ref 3.6–11.0)

## 2016-10-28 LAB — SEDIMENTATION RATE: SED RATE: 17 mm/h (ref 0–30)

## 2016-10-28 LAB — C-REACTIVE PROTEIN: CRP: 1.1 mg/dL — AB (ref <1.0)

## 2016-10-28 LAB — APTT: APTT: 36 s (ref 24–36)

## 2016-10-28 NOTE — Patient Instructions (Addendum)
  Your procedure is scheduled on: 11/09/16 Mon Report to Same Day Surgery 2nd floor medical mall [facility] Entrance-take elevator on left to 2nd floor.  Check in with surgery information desk.) To find out your arrival time please call [phone] between 1PM - 3PM on 11/06/16 Fri  Remember: Instructions that are not followed completely may result in serious medical risk, up to and including death, or upon the discretion of your surgeon and anesthesiologist your surgery may need to be rescheduled.    _x___ 1. Do not eat food or drink liquids after midnight. No gum chewing or hard candies.     __x__ 2. No Alcohol for 24 hours before or after surgery.   __x__3. No Smoking for 24 prior to surgery.   ____  4. Bring all medications with you on the day of surgery if instructed.    __x__ 5. Notify your doctor if there is any change in your medical condition     (cold, fever, infections).     Do not wear jewelry, make-up, hairpins, clips or nail polish.  Do not wear lotions, powders, or perfumes. You may wear deodorant.  Do not shave 48 hours prior to surgery. Men may shave face and neck.  Do not bring valuables to the hospital.    [facility] is not responsible for any belongings or valuables.               Contacts, dentures or bridgework may not be worn into surgery.  Leave your suitcase in the car. After surgery it may be brought to your room.  For patients admitted to the hospital, discharge time is determined by your treatment team.   Patients discharged the day of surgery will not be allowed to drive home.  You will need someone to drive you home and stay with you the night of your procedure.    Please read over the following fact sheets that you were given:   [facility] Preparing for Surgery and or MRSA Information   _x___ Take these medicines the morning of surgery with A SIP OF WATER:    1. None  2.  3.  4.  5.  6.  ____Fleets enema or Magnesium Citrate as  directed.   _x___ Use CHG Soap or sage wipes as directed on instruction sheet   ____ Use inhalers on the day of surgery and bring to hospital day of surgery  ____ Stop metformin 2 days prior to surgery    ____ Take 1/2 of usual insulin dose the night before surgery and none on the morning of           surgery.   ____ Stop Aspirin, Coumadin, Pllavix ,Eliquis, Effient, or Pradaxa  x__ Stop Anti-inflammatories such as Advil, Aleve, Ibuprofen, Motrin, Naproxen,          Naprosyn, Goodies powders or aspirin products. Ok to take Tylenol. Stop Lodien and naproxen 1 week before surgery.   _x___ Stop supplements until after surgery.  Omega   ____ Bring C-Pap to the hospital.

## 2016-10-28 NOTE — Pre-Procedure Instructions (Signed)
Called Dr Randa Lynn regarding EKG.  "It looks OK to me, we are fine."

## 2016-10-29 ENCOUNTER — Other Ambulatory Visit: Payer: Self-pay

## 2016-10-29 LAB — URINE CULTURE
CULTURE: NO GROWTH
SPECIAL REQUESTS: NORMAL

## 2016-10-29 LAB — HEMOGLOBIN A1C
Hgb A1c MFr Bld: 6.1 % — ABNORMAL HIGH (ref 4.8–5.6)
Mean Plasma Glucose: 128 mg/dL

## 2016-10-29 NOTE — Pre-Procedure Instructions (Signed)
No ua results, called patient for another specimen.

## 2016-10-30 NOTE — Pre-Procedure Instructions (Signed)
EKG sent to Anesthesia for review. 

## 2016-11-03 ENCOUNTER — Encounter
Admission: RE | Admit: 2016-11-03 | Discharge: 2016-11-03 | Disposition: A | Discharge status: Home / Self Care | Payer: 59 | Source: Ambulatory Visit | Attending: Orthopedic Surgery | Admitting: Orthopedic Surgery

## 2016-11-03 DIAGNOSIS — Z01818 Encounter for other preprocedural examination: Secondary | ICD-10-CM | POA: Insufficient documentation

## 2016-11-03 LAB — URINALYSIS, COMPLETE (UACMP) WITH MICROSCOPIC
BILIRUBIN URINE: NEGATIVE
Bacteria, UA: NONE SEEN
GLUCOSE, UA: NEGATIVE mg/dL
Hgb urine dipstick: NEGATIVE
KETONES UR: NEGATIVE mg/dL
LEUKOCYTES UA: NEGATIVE
NITRITE: NEGATIVE
PH: 6 (ref 5.0–8.0)
PROTEIN: NEGATIVE mg/dL
Specific Gravity, Urine: 1.018 (ref 1.005–1.030)

## 2016-11-03 NOTE — Pre-Procedure Instructions (Signed)
AS REQUESTED BY DR Barbette Hair CALLED AND FAXED TO DR HOOTEN'S OFFICE. SPOKE WITH TIFFANY

## 2016-11-04 DIAGNOSIS — R9431 Abnormal electrocardiogram [ECG] [EKG]: Secondary | ICD-10-CM | POA: Diagnosis not present

## 2016-11-04 DIAGNOSIS — Z01818 Encounter for other preprocedural examination: Secondary | ICD-10-CM | POA: Diagnosis not present

## 2016-11-05 ENCOUNTER — Other Ambulatory Visit: Payer: Self-pay

## 2016-11-05 ENCOUNTER — Other Ambulatory Visit: Payer: Self-pay | Admitting: Cardiology

## 2016-11-05 VITALS — BP 150/86 | HR 61 | Ht 65.0 in | Wt 218.2 lb

## 2016-11-05 DIAGNOSIS — R9431 Abnormal electrocardiogram [ECG] [EKG]: Secondary | ICD-10-CM | POA: Diagnosis not present

## 2016-11-05 DIAGNOSIS — Z01810 Encounter for preprocedural cardiovascular examination: Secondary | ICD-10-CM | POA: Diagnosis not present

## 2016-11-05 DIAGNOSIS — E119 Type 2 diabetes mellitus without complications: Secondary | ICD-10-CM

## 2016-11-05 DIAGNOSIS — R0602 Shortness of breath: Secondary | ICD-10-CM | POA: Diagnosis not present

## 2016-11-05 NOTE — Patient Outreach (Signed)
[facility]) Care Management  [place]  11/05/2016   Kendra Downs 1945-12-14 [id number]  Subjective: Met with patient as part of the [facility] diabetes program.  Patient is scheduled for surgery on Monday, January 8th for a knee replacement but is concerned because the MD has sent her to a cardiologist and the cardiologist wants her to have a a stress test tomorrow morning before they will allow her to have surgery.  She has not been taking her blood sugars and only has 5 readings over the past month.  She has no complaints other than pain in her right knee when she is walking and standing- she denies pain when sitting.   Objective:  Vitals:   11/05/16 1433  BP: (!) 150/86  Pulse: 61  SpO2: 96%  Weight: 218 lb 3.2 oz (99 kg)  Height: 1.651 m ('5\' 5"'$ )     Encounter Medications:  Outpatient Encounter Prescriptions as of 11/05/2016  Medication Sig  . hydrochlorothiazide (MICROZIDE) 12.5 MG capsule Take 12.5 mg by mouth daily.  . quinapril-hydrochlorothiazide (ACCURETIC) 20-12.5 MG tablet Take 1 tablet by mouth daily.   Marland Kitchen COENZYME Q10-OMEGA 3 FATTY ACD PO Take 2 capsules by mouth daily.  Marland Kitchen etodolac (LODINE) 500 MG tablet Take 500 mg by mouth 2 (two) times daily as needed.   . naproxen sodium (ANAPROX) 220 MG tablet Take 220 mg by mouth 2 (two) times daily as needed.  . Vitamin D, Ergocalciferol, (DRISDOL) 50000 units CAPS capsule Take 50,000 Units by mouth every 7 (seven) days. Takes either Sat and Sun   No facility-administered encounter medications on file as of 11/05/2016.     Functional Status:  In your present state of health, do you have any difficulty performing the following activities: 10/28/2016 11/27/2015  Hearing? N N  Vision? N N  Difficulty concentrating or making decisions? N N  Walking or climbing stairs? Y Y  Dressing or bathing? N N  Doing errands, shopping? N N  Some recent data might be hidden    Fall/Depression  Screening: PHQ 2/9 Scores 05/27/2016 11/27/2015 08/21/2015 08/12/2015  PHQ - 2 Score 1 0 4 4  PHQ- 9 Score - - 6 6    Assessment: Patient anxiously awaiting knee replacement and has her sister and grandaughters lined up to help her.  No complaints of SOB, chest pain, hyper or hypoglycemia.  She denies depression.    We discussed the need to take pain medication after surgery as ordered and to exercise as prescribed. Encouraged to check blood sugars 2x/day to determine trends.  [facility] CM Care Plan Problem One   Flowsheet Row Most Recent Value  Care Plan for Problem One  Active  [facility] Long Term Goal (31-90 days)  Patient will maintain an A1C of less than 6.5% when it is checked next by MD or [facility]  [facility]N Long Term Goal Start Date  11/05/16  [facility] Long Term Goal Met Date  -- [met on 7/26- reinstate goal]  Interventions for Problem One Long Term Goal  1. discussed the impact of stress and pain on blood sugar control- encouraged to take pain medication as ordered and to check blood sugars bid.        Follow up in 3-6 months   Gentry Fitz, RN, [place], [place], [place]:  [phone]  Fax:  [phone] E-mail: [email or web address] [address], [place], [place]  [address]

## 2016-11-06 ENCOUNTER — Encounter
Admission: RE | Admit: 2016-11-06 | Discharge: 2016-11-06 | Disposition: A | Discharge status: Home / Self Care | Payer: 59 | Source: Ambulatory Visit | Attending: Cardiology | Admitting: Cardiology

## 2016-11-06 DIAGNOSIS — R9431 Abnormal electrocardiogram [ECG] [EKG]: Secondary | ICD-10-CM | POA: Diagnosis not present

## 2016-11-06 LAB — NM MYOCAR MULTI W/SPECT W/WALL MOTION / EF
CHL CUP NUCLEAR SRS: 1
CSEPED: 1 min
CSEPEDS: 1 s
CSEPHR: 62 %
Estimated workload: 1 METS
LVDIAVOL: 52 mL (ref 46–106)
LVSYSVOL: 17 mL
MPHR: 148 {beats}/min
Peak HR: 93 {beats}/min
Rest HR: 62 {beats}/min
SDS: 0
SSS: 0
TID: 0.68

## 2016-11-06 MED ORDER — REGADENOSON 0.4 MG/5ML IV SOLN
0.4000 mg | Freq: Once | INTRAVENOUS | Status: AC
  Administered 2016-11-06: 0.4 mg via INTRAVENOUS

## 2016-11-06 MED ORDER — TECHNETIUM TC 99M TETROFOSMIN IV KIT
14.0600 | PACK | Freq: Once | INTRAVENOUS | Status: AC | PRN
  Administered 2016-11-06: 14.06 via INTRAVENOUS

## 2016-11-06 MED ORDER — TECHNETIUM TC 99M TETROFOSMIN IV KIT
32.5500 | PACK | Freq: Once | INTRAVENOUS | Status: AC | PRN
  Administered 2016-11-06: 32.55 via INTRAVENOUS

## 2016-11-08 MED ORDER — CEFAZOLIN SODIUM-DEXTROSE 2-4 GM/100ML-% IV SOLN
2.0000 g | INTRAVENOUS | Status: AC
  Administered 2016-11-09: 2 g via INTRAVENOUS

## 2016-11-09 ENCOUNTER — Encounter: Payer: Self-pay | Admitting: *Deleted

## 2016-11-09 ENCOUNTER — Ambulatory Visit: Payer: Self-pay

## 2016-11-09 ENCOUNTER — Encounter
Admission: RE | Disposition: A | Discharge status: Home Health | Payer: Self-pay | Source: Ambulatory Visit | Attending: Orthopedic Surgery

## 2016-11-09 ENCOUNTER — Inpatient Hospital Stay: Payer: 59 | Admitting: Certified Registered Nurse Anesthetist

## 2016-11-09 ENCOUNTER — Inpatient Hospital Stay
Admission: RE | Admit: 2016-11-09 | Discharge: 2016-11-11 | DRG: 470 | Disposition: A | Discharge status: Home Health | Payer: 59 | Source: Ambulatory Visit | Attending: Orthopedic Surgery | Admitting: Orthopedic Surgery

## 2016-11-09 ENCOUNTER — Inpatient Hospital Stay: Payer: 59

## 2016-11-09 DIAGNOSIS — Z7984 Long term (current) use of oral hypoglycemic drugs: Secondary | ICD-10-CM | POA: Diagnosis not present

## 2016-11-09 DIAGNOSIS — R262 Difficulty in walking, not elsewhere classified: Secondary | ICD-10-CM

## 2016-11-09 DIAGNOSIS — Z471 Aftercare following joint replacement surgery: Secondary | ICD-10-CM | POA: Diagnosis not present

## 2016-11-09 DIAGNOSIS — I10 Essential (primary) hypertension: Secondary | ICD-10-CM | POA: Diagnosis not present

## 2016-11-09 DIAGNOSIS — I739 Peripheral vascular disease, unspecified: Secondary | ICD-10-CM | POA: Diagnosis present

## 2016-11-09 DIAGNOSIS — E119 Type 2 diabetes mellitus without complications: Secondary | ICD-10-CM | POA: Diagnosis present

## 2016-11-09 DIAGNOSIS — M25661 Stiffness of right knee, not elsewhere classified: Secondary | ICD-10-CM

## 2016-11-09 DIAGNOSIS — Z96651 Presence of right artificial knee joint: Secondary | ICD-10-CM

## 2016-11-09 DIAGNOSIS — M1711 Unilateral primary osteoarthritis, right knee: Principal | ICD-10-CM | POA: Diagnosis present

## 2016-11-09 DIAGNOSIS — Z79899 Other long term (current) drug therapy: Secondary | ICD-10-CM | POA: Diagnosis not present

## 2016-11-09 DIAGNOSIS — Z791 Long term (current) use of non-steroidal anti-inflammatories (NSAID): Secondary | ICD-10-CM

## 2016-11-09 DIAGNOSIS — Z87891 Personal history of nicotine dependence: Secondary | ICD-10-CM

## 2016-11-09 DIAGNOSIS — Z96659 Presence of unspecified artificial knee joint: Secondary | ICD-10-CM

## 2016-11-09 DIAGNOSIS — M6281 Muscle weakness (generalized): Secondary | ICD-10-CM

## 2016-11-09 HISTORY — PX: JOINT REPLACEMENT: SHX530

## 2016-11-09 HISTORY — PX: KNEE ARTHROPLASTY: SHX992

## 2016-11-09 LAB — GLUCOSE, CAPILLARY
GLUCOSE-CAPILLARY: 135 mg/dL — AB (ref 65–99)
GLUCOSE-CAPILLARY: 141 mg/dL — AB (ref 65–99)
GLUCOSE-CAPILLARY: 142 mg/dL — AB (ref 65–99)
Glucose-Capillary: 128 mg/dL — ABNORMAL HIGH (ref 65–99)

## 2016-11-09 LAB — ABO/RH: ABO/RH(D): O POS

## 2016-11-09 SURGERY — ARTHROPLASTY, KNEE, TOTAL, USING IMAGELESS COMPUTER-ASSISTED NAVIGATION
Anesthesia: Regional | Site: Knee | Laterality: Right | Wound class: Clean

## 2016-11-09 MED ORDER — BUPIVACAINE HCL (PF) 0.5 % IJ SOLN
INTRAMUSCULAR | Status: DC | PRN
  Administered 2016-11-09: 3 mL

## 2016-11-09 MED ORDER — ENOXAPARIN SODIUM 30 MG/0.3ML ~~LOC~~ SOLN
30.0000 mg | Freq: Two times a day (BID) | SUBCUTANEOUS | Status: DC
  Administered 2016-11-10 – 2016-11-11 (×3): 30 mg via SUBCUTANEOUS
  Filled 2016-11-09 (×3): qty 0.3

## 2016-11-09 MED ORDER — KETAMINE HCL 10 MG/ML IJ SOLN
INTRAMUSCULAR | Status: DC | PRN
  Administered 2016-11-09: 10 mg via INTRAVENOUS
  Administered 2016-11-09: 30 mg via INTRAVENOUS

## 2016-11-09 MED ORDER — CEFAZOLIN SODIUM-DEXTROSE 2-4 GM/100ML-% IV SOLN
2.0000 g | Freq: Four times a day (QID) | INTRAVENOUS | Status: AC
  Administered 2016-11-09 – 2016-11-10 (×4): 2 g via INTRAVENOUS
  Filled 2016-11-09 (×4): qty 100

## 2016-11-09 MED ORDER — PHENOL 1.4 % MT LIQD
1.0000 | OROMUCOSAL | Status: DC | PRN
  Filled 2016-11-09: qty 177

## 2016-11-09 MED ORDER — MORPHINE SULFATE (PF) 2 MG/ML IV SOLN
2.0000 mg | INTRAVENOUS | Status: DC | PRN
  Administered 2016-11-09 (×2): 2 mg via INTRAVENOUS
  Filled 2016-11-09 (×2): qty 1

## 2016-11-09 MED ORDER — ACETAMINOPHEN 10 MG/ML IV SOLN
1000.0000 mg | Freq: Four times a day (QID) | INTRAVENOUS | Status: AC
  Administered 2016-11-09 – 2016-11-10 (×4): 1000 mg via INTRAVENOUS
  Filled 2016-11-09 (×4): qty 100

## 2016-11-09 MED ORDER — TRANEXAMIC ACID 1000 MG/10ML IV SOLN
1000.0000 mg | Freq: Once | INTRAVENOUS | Status: AC
  Administered 2016-11-09: 1000 mg via INTRAVENOUS
  Filled 2016-11-09 (×2): qty 10

## 2016-11-09 MED ORDER — LIDOCAINE HCL (CARDIAC) 20 MG/ML IV SOLN
INTRAVENOUS | Status: DC | PRN
  Administered 2016-11-09 (×2): 50 mg via INTRAVENOUS

## 2016-11-09 MED ORDER — OXYCODONE HCL 5 MG PO TABS
5.0000 mg | ORAL_TABLET | ORAL | Status: DC | PRN
  Administered 2016-11-09: 10 mg via ORAL
  Administered 2016-11-09 (×2): 5 mg via ORAL
  Administered 2016-11-10 – 2016-11-11 (×6): 10 mg via ORAL
  Filled 2016-11-09 (×3): qty 2
  Filled 2016-11-09: qty 1
  Filled 2016-11-09 (×2): qty 2
  Filled 2016-11-09: qty 1
  Filled 2016-11-09 (×2): qty 2

## 2016-11-09 MED ORDER — FENTANYL CITRATE (PF) 100 MCG/2ML IJ SOLN
INTRAMUSCULAR | Status: AC
  Filled 2016-11-09: qty 2

## 2016-11-09 MED ORDER — ACETAMINOPHEN 650 MG RE SUPP: 650.0000 mg | Freq: Four times a day (QID) | RECTAL | Status: DC | PRN

## 2016-11-09 MED ORDER — ACETAMINOPHEN 10 MG/ML IV SOLN
INTRAVENOUS | Status: AC
  Filled 2016-11-09: qty 100

## 2016-11-09 MED ORDER — ACETAMINOPHEN 325 MG PO TABS: 650.0000 mg | ORAL_TABLET | Freq: Four times a day (QID) | ORAL | Status: DC | PRN

## 2016-11-09 MED ORDER — FENTANYL CITRATE (PF) 100 MCG/2ML IJ SOLN
INTRAMUSCULAR | Status: DC | PRN
  Administered 2016-11-09 (×4): 50 ug via INTRAVENOUS

## 2016-11-09 MED ORDER — FAMOTIDINE 20 MG PO TABS
ORAL_TABLET | ORAL | Status: AC
  Filled 2016-11-09: qty 1

## 2016-11-09 MED ORDER — QUINAPRIL-HYDROCHLOROTHIAZIDE 20-12.5 MG PO TABS: 1.0000 | ORAL_TABLET | Freq: Every day | ORAL | Status: DC

## 2016-11-09 MED ORDER — CEFAZOLIN SODIUM-DEXTROSE 2-4 GM/100ML-% IV SOLN
INTRAVENOUS | Status: AC
  Filled 2016-11-09: qty 100

## 2016-11-09 MED ORDER — BUPIVACAINE HCL (PF) 0.25 % IJ SOLN
INTRAMUSCULAR | Status: AC
  Filled 2016-11-09: qty 30

## 2016-11-09 MED ORDER — CHLORHEXIDINE GLUCONATE 4 % EX LIQD: 60.0000 mL | Freq: Once | CUTANEOUS | Status: DC

## 2016-11-09 MED ORDER — TRAMADOL HCL 50 MG PO TABS
50.0000 mg | ORAL_TABLET | ORAL | Status: DC | PRN
  Administered 2016-11-10 – 2016-11-11 (×4): 100 mg via ORAL
  Filled 2016-11-09 (×4): qty 2

## 2016-11-09 MED ORDER — BUPIVACAINE HCL (PF) 0.5 % IJ SOLN
INTRAMUSCULAR | Status: AC
  Filled 2016-11-09: qty 10

## 2016-11-09 MED ORDER — DIPHENHYDRAMINE HCL 12.5 MG/5ML PO ELIX: 12.5000 mg | ORAL_SOLUTION | ORAL | Status: DC | PRN

## 2016-11-09 MED ORDER — MENTHOL 3 MG MT LOZG
1.0000 | LOZENGE | OROMUCOSAL | Status: DC | PRN
  Filled 2016-11-09: qty 9

## 2016-11-09 MED ORDER — KETAMINE HCL 10 MG/ML IJ SOLN
INTRAMUSCULAR | Status: AC
  Filled 2016-11-09: qty 1

## 2016-11-09 MED ORDER — ACETAMINOPHEN 10 MG/ML IV SOLN
INTRAVENOUS | Status: DC | PRN
  Administered 2016-11-09: 1000 mg via INTRAVENOUS

## 2016-11-09 MED ORDER — ONDANSETRON HCL 4 MG PO TABS: 4.0000 mg | ORAL_TABLET | Freq: Four times a day (QID) | ORAL | Status: DC | PRN

## 2016-11-09 MED ORDER — ONDANSETRON HCL 4 MG/2ML IJ SOLN: 4.0000 mg | Freq: Once | INTRAMUSCULAR | Status: DC | PRN

## 2016-11-09 MED ORDER — SODIUM CHLORIDE 0.9 % IV SOLN
INTRAVENOUS | Status: DC
  Administered 2016-11-09: 07:00:00 via INTRAVENOUS

## 2016-11-09 MED ORDER — ONDANSETRON HCL 4 MG/2ML IJ SOLN
INTRAMUSCULAR | Status: AC
  Filled 2016-11-09: qty 2

## 2016-11-09 MED ORDER — FENTANYL CITRATE (PF) 100 MCG/2ML IJ SOLN: 25.0000 ug | INTRAMUSCULAR | Status: DC | PRN

## 2016-11-09 MED ORDER — METOCLOPRAMIDE HCL 10 MG PO TABS
10.0000 mg | ORAL_TABLET | Freq: Three times a day (TID) | ORAL | Status: AC
  Administered 2016-11-09 – 2016-11-11 (×8): 10 mg via ORAL
  Filled 2016-11-09 (×8): qty 1

## 2016-11-09 MED ORDER — ONDANSETRON HCL 4 MG/2ML IJ SOLN: 4.0000 mg | Freq: Four times a day (QID) | INTRAMUSCULAR | Status: DC | PRN

## 2016-11-09 MED ORDER — MIDAZOLAM HCL 2 MG/2ML IJ SOLN
INTRAMUSCULAR | Status: AC
  Filled 2016-11-09: qty 2

## 2016-11-09 MED ORDER — HYDROCHLOROTHIAZIDE 12.5 MG PO CAPS
12.5000 mg | ORAL_CAPSULE | Freq: Every day | ORAL | Status: DC
  Administered 2016-11-10: 12.5 mg via ORAL
  Filled 2016-11-09: qty 1

## 2016-11-09 MED ORDER — BUPIVACAINE LIPOSOME 1.3 % IJ SUSP
INTRAMUSCULAR | Status: AC
  Filled 2016-11-09: qty 20

## 2016-11-09 MED ORDER — PROPOFOL 10 MG/ML IV BOLUS
INTRAVENOUS | Status: AC
  Filled 2016-11-09: qty 40

## 2016-11-09 MED ORDER — BUPIVACAINE HCL (PF) 0.25 % IJ SOLN
INTRAMUSCULAR | Status: DC | PRN
  Administered 2016-11-09: 60 mL

## 2016-11-09 MED ORDER — SODIUM CHLORIDE 0.9 % IJ SOLN
INTRAMUSCULAR | Status: AC
  Filled 2016-11-09: qty 50

## 2016-11-09 MED ORDER — TETRACAINE HCL 1 % IJ SOLN
INTRAMUSCULAR | Status: AC
  Filled 2016-11-09: qty 2

## 2016-11-09 MED ORDER — LIDOCAINE HCL 2 % EX GEL
CUTANEOUS | Status: AC
  Filled 2016-11-09: qty 5

## 2016-11-09 MED ORDER — QUINAPRIL HCL 10 MG PO TABS
20.0000 mg | ORAL_TABLET | Freq: Every day | ORAL | Status: DC
  Administered 2016-11-10 – 2016-11-11 (×2): 20 mg via ORAL
  Filled 2016-11-09 (×4): qty 2

## 2016-11-09 MED ORDER — SODIUM CHLORIDE 0.9 % IV SOLN
INTRAVENOUS | Status: DC
Start: 2016-11-09 — End: 2016-11-11
  Administered 2016-11-09 (×2): via INTRAVENOUS

## 2016-11-09 MED ORDER — SODIUM CHLORIDE 0.9 % IV SOLN
INTRAVENOUS | Status: DC | PRN
  Administered 2016-11-09: 60 mL

## 2016-11-09 MED ORDER — SODIUM CHLORIDE FLUSH 0.9 % IV SOLN
INTRAVENOUS | Status: AC
  Filled 2016-11-09: qty 10

## 2016-11-09 MED ORDER — FERROUS SULFATE 325 (65 FE) MG PO TABS
325.0000 mg | ORAL_TABLET | Freq: Two times a day (BID) | ORAL | Status: DC
  Administered 2016-11-09 – 2016-11-11 (×4): 325 mg via ORAL
  Filled 2016-11-09 (×4): qty 1

## 2016-11-09 MED ORDER — LIDOCAINE 2% (20 MG/ML) 5 ML SYRINGE
INTRAMUSCULAR | Status: AC
  Filled 2016-11-09: qty 5

## 2016-11-09 MED ORDER — TRANEXAMIC ACID 1000 MG/10ML IV SOLN
1000.0000 mg | INTRAVENOUS | Status: AC
  Administered 2016-11-09: 1000 mg via INTRAVENOUS
  Filled 2016-11-09: qty 10

## 2016-11-09 MED ORDER — PANTOPRAZOLE SODIUM 40 MG PO TBEC
40.0000 mg | DELAYED_RELEASE_TABLET | Freq: Two times a day (BID) | ORAL | Status: DC
  Administered 2016-11-09 – 2016-11-11 (×4): 40 mg via ORAL
  Filled 2016-11-09 (×4): qty 1

## 2016-11-09 MED ORDER — NEOMYCIN-POLYMYXIN B GU 40-200000 IR SOLN
Status: AC
  Filled 2016-11-09: qty 20

## 2016-11-09 MED ORDER — HYDROCHLOROTHIAZIDE 12.5 MG PO CAPS
12.5000 mg | ORAL_CAPSULE | Freq: Every day | ORAL | Status: DC
  Administered 2016-11-10 – 2016-11-11 (×2): 12.5 mg via ORAL
  Filled 2016-11-09 (×2): qty 1

## 2016-11-09 MED ORDER — CELECOXIB 200 MG PO CAPS
200.0000 mg | ORAL_CAPSULE | Freq: Two times a day (BID) | ORAL | Status: DC
Start: 2016-11-09 — End: 2016-11-11
  Administered 2016-11-09 – 2016-11-11 (×4): 200 mg via ORAL
  Filled 2016-11-09 (×4): qty 1

## 2016-11-09 MED ORDER — ALUM & MAG HYDROXIDE-SIMETH 200-200-20 MG/5ML PO SUSP: 30.0000 mL | ORAL | Status: DC | PRN

## 2016-11-09 MED ORDER — PHENYLEPHRINE HCL 10 MG/ML IJ SOLN
INTRAMUSCULAR | Status: AC
  Filled 2016-11-09: qty 1

## 2016-11-09 MED ORDER — PROPOFOL 500 MG/50ML IV EMUL
INTRAVENOUS | Status: DC | PRN
  Administered 2016-11-09: 75 ug/kg/min via INTRAVENOUS

## 2016-11-09 MED ORDER — PROPOFOL 500 MG/50ML IV EMUL
INTRAVENOUS | Status: AC
  Filled 2016-11-09: qty 100

## 2016-11-09 MED ORDER — FLEET ENEMA 7-19 GM/118ML RE ENEM: 1.0000 | ENEMA | Freq: Once | RECTAL | Status: DC | PRN

## 2016-11-09 MED ORDER — PROPOFOL 10 MG/ML IV BOLUS
INTRAVENOUS | Status: DC | PRN
  Administered 2016-11-09: 10 mg via INTRAVENOUS
  Administered 2016-11-09: 20 mg via INTRAVENOUS

## 2016-11-09 MED ORDER — MAGNESIUM HYDROXIDE 400 MG/5ML PO SUSP
30.0000 mL | Freq: Every day | ORAL | Status: DC | PRN
  Administered 2016-11-10: 30 mL via ORAL
  Filled 2016-11-09: qty 30

## 2016-11-09 MED ORDER — MIDAZOLAM HCL 5 MG/5ML IJ SOLN
INTRAMUSCULAR | Status: DC | PRN
  Administered 2016-11-09: 2 mg via INTRAVENOUS

## 2016-11-09 MED ORDER — ONDANSETRON HCL 4 MG/2ML IJ SOLN
INTRAMUSCULAR | Status: DC | PRN
  Administered 2016-11-09: 4 mg via INTRAVENOUS

## 2016-11-09 MED ORDER — NEOMYCIN-POLYMYXIN B GU 40-200000 IR SOLN
Status: DC | PRN
  Administered 2016-11-09: 14 mL

## 2016-11-09 MED ORDER — SENNOSIDES-DOCUSATE SODIUM 8.6-50 MG PO TABS
1.0000 | ORAL_TABLET | Freq: Two times a day (BID) | ORAL | Status: DC
  Administered 2016-11-09 – 2016-11-11 (×5): 1 via ORAL
  Filled 2016-11-09 (×5): qty 1

## 2016-11-09 MED ORDER — BISACODYL 10 MG RE SUPP
10.0000 mg | Freq: Every day | RECTAL | Status: DC | PRN
  Administered 2016-11-11: 10 mg via RECTAL
  Filled 2016-11-09 (×2): qty 1

## 2016-11-09 MED ORDER — SODIUM CHLORIDE 0.9 % IV SOLN
Freq: Once | INTRAVENOUS | Status: DC
Start: 2016-11-09 — End: 2016-11-11

## 2016-11-09 MED ORDER — VITAMIN D (ERGOCALCIFEROL) 1.25 MG (50000 UNIT) PO CAPS
50000.0000 [IU] | ORAL_CAPSULE | ORAL | Status: DC
  Filled 2016-11-09: qty 1

## 2016-11-09 MED ORDER — INSULIN ASPART 100 UNIT/ML ~~LOC~~ SOLN
0.0000 [IU] | Freq: Three times a day (TID) | SUBCUTANEOUS | Status: DC
Start: 2016-11-09 — End: 2016-11-11
  Administered 2016-11-09 – 2016-11-10 (×3): 2 [IU] via SUBCUTANEOUS
  Filled 2016-11-09 (×3): qty 2

## 2016-11-09 MED ORDER — EPINEPHRINE PF 1 MG/ML IJ SOLN
INTRAMUSCULAR | Status: DC | PRN
  Administered 2016-11-09: .02 mg via INTRAVENOUS

## 2016-11-09 MED ORDER — FAMOTIDINE 20 MG PO TABS
20.0000 mg | ORAL_TABLET | Freq: Once | ORAL | Status: AC
  Administered 2016-11-09: 20 mg via ORAL

## 2016-11-09 SURGICAL SUPPLY — 61 items
AUTOTRANSFUS HAS 1/8 (MISCELLANEOUS) ×2
BATTERY INSTRU NAVIGATION (MISCELLANEOUS) ×8 IMPLANT
BLADE SAW 1 (BLADE) ×2 IMPLANT
BLADE SAW 1/2 (BLADE) ×2 IMPLANT
BLADE SAW 70X12.5 (BLADE) IMPLANT
CANISTER SUCT 1200ML W/VALVE (MISCELLANEOUS) ×2 IMPLANT
CANISTER SUCT 3000ML (MISCELLANEOUS) ×4 IMPLANT
CAPT KNEE TOTAL 3 ATTUNE ×2 IMPLANT
CATH TRAY METER 16FR LF (MISCELLANEOUS) ×2 IMPLANT
CEMENT HV SMART SET (Cement) ×4 IMPLANT
COOLER POLAR GLACIER W/PUMP (MISCELLANEOUS) ×2 IMPLANT
CUFF TOURN 24 STER (MISCELLANEOUS) IMPLANT
CUFF TOURN 30 STER DUAL PORT (MISCELLANEOUS) ×2 IMPLANT
DECANTER SPIKE VIAL GLASS SM (MISCELLANEOUS) ×6 IMPLANT
DRAPE SHEET LG 3/4 BI-LAMINATE (DRAPES) ×2 IMPLANT
DRSG DERMACEA 8X12 NADH (GAUZE/BANDAGES/DRESSINGS) ×2 IMPLANT
DRSG OPSITE POSTOP 4X14 (GAUZE/BANDAGES/DRESSINGS) ×2 IMPLANT
DRSG TEGADERM 4X4.75 (GAUZE/BANDAGES/DRESSINGS) ×2 IMPLANT
DURAPREP 26ML APPLICATOR (WOUND CARE) ×4 IMPLANT
ELECT CAUTERY BLADE 6.4 (BLADE) ×2 IMPLANT
ELECT REM PT RETURN 9FT ADLT (ELECTROSURGICAL) ×2
ELECTRODE REM PT RTRN 9FT ADLT (ELECTROSURGICAL) ×1 IMPLANT
EX-PIN ORTHOLOCK NAV 4X150 (PIN) ×4 IMPLANT
GLOVE BIOGEL M STRL SZ7.5 (GLOVE) ×4 IMPLANT
GLOVE INDICATOR 8.0 STRL GRN (GLOVE) ×2 IMPLANT
GLOVE SURG 9.0 ORTHO LTXF (GLOVE) ×2 IMPLANT
GLOVE SURG ORTHO 9.0 STRL STRW (GLOVE) ×2 IMPLANT
GOWN STRL REUS W/ TWL LRG LVL3 (GOWN DISPOSABLE) ×2 IMPLANT
GOWN STRL REUS W/TWL 2XL LVL3 (GOWN DISPOSABLE) ×2 IMPLANT
GOWN STRL REUS W/TWL LRG LVL3 (GOWN DISPOSABLE) ×2
HANDPIECE INTERPULSE COAX TIP (DISPOSABLE) ×1
HOLDER FOLEY CATH W/STRAP (MISCELLANEOUS) ×2 IMPLANT
HOOD PEEL AWAY FLYTE STAYCOOL (MISCELLANEOUS) ×4 IMPLANT
KIT RM TURNOVER STRD PROC AR (KITS) ×2 IMPLANT
KNIFE SCULPS 14X20 (INSTRUMENTS) ×2 IMPLANT
LABEL OR SOLS (LABEL) ×2 IMPLANT
NDL SAFETY 18GX1.5 (NEEDLE) ×2 IMPLANT
NEEDLE SPNL 20GX3.5 QUINCKE YW (NEEDLE) ×2 IMPLANT
NS IRRIG 500ML POUR BTL (IV SOLUTION) ×2 IMPLANT
PACK TOTAL KNEE (MISCELLANEOUS) ×2 IMPLANT
PAD WRAPON POLAR KNEE (MISCELLANEOUS) ×1 IMPLANT
PIN DRILL QUICK PACK ×2 IMPLANT
PIN FIXATION 1/8DIA X 3INL (PIN) ×2 IMPLANT
SET HNDPC FAN SPRY TIP SCT (DISPOSABLE) ×1 IMPLANT
SOL .9 NS 3000ML IRR  AL (IV SOLUTION) ×1
SOL .9 NS 3000ML IRR UROMATIC (IV SOLUTION) ×1 IMPLANT
SOL PREP PVP 2OZ (MISCELLANEOUS) ×2
SOLUTION PREP PVP 2OZ (MISCELLANEOUS) ×1 IMPLANT
SPONGE DRAIN TRACH 4X4 STRL 2S (GAUZE/BANDAGES/DRESSINGS) ×2 IMPLANT
STAPLER SKIN PROX 35W (STAPLE) ×2 IMPLANT
SUCTION FRAZIER HANDLE 10FR (MISCELLANEOUS) ×1
SUCTION TUBE FRAZIER 10FR DISP (MISCELLANEOUS) ×1 IMPLANT
SUT VIC AB 0 CT1 36 (SUTURE) ×2 IMPLANT
SUT VIC AB 1 CT1 36 (SUTURE) ×4 IMPLANT
SUT VIC AB 2-0 CT2 27 (SUTURE) ×2 IMPLANT
SYR 20CC LL (SYRINGE) ×2 IMPLANT
SYR 30ML LL (SYRINGE) ×4 IMPLANT
SYSTEM AUTOTRANSFUS DUAL TROCR (MISCELLANEOUS) ×1 IMPLANT
TOWEL OR 17X26 4PK STRL BLUE (TOWEL DISPOSABLE) ×2 IMPLANT
TOWER CARTRIDGE SMART MIX (DISPOSABLE) ×2 IMPLANT
WRAPON POLAR PAD KNEE (MISCELLANEOUS) ×2

## 2016-11-09 NOTE — OR Nursing (Signed)
Drain activated at 11:15

## 2016-11-09 NOTE — Transfer of Care (Signed)
Immediate Anesthesia Transfer of Care Note  Patient: Kendra Downs  Procedure(s) Performed: Procedure(s): COMPUTER ASSISTED TOTAL KNEE ARTHROPLASTY (Right)  Patient Location: PACU  Anesthesia Type:Spinal  Level of Consciousness: awake, alert  and oriented  Airway & Oxygen Therapy: Patient connected to face mask oxygen  Post-op Assessment: Post -op Vital signs reviewed and stable  Post vital signs: stable  Last Vitals:  Vitals:   11/09/16 0613 11/09/16 1126  BP: (!) 143/72 110/75  Pulse: (!) 58 (!) 49  Resp: 16 15  Temp: 36.4 C (!) 36.1 C    Last Pain:  Vitals:   11/09/16 1126  TempSrc: Temporal  PainSc:          Complications: No apparent anesthesia complications

## 2016-11-09 NOTE — Care Management (Signed)
Attempted assessment and patient ask that I come back tomorrow. RNCM to follow up

## 2016-11-09 NOTE — Progress Notes (Signed)
Responding the CH's requested to visit the Pt, who is also a Curator. Ch visited the Pt. Pt alert and communicative. Pt appeared to be exhausted as she was recovery from a knee surgery. Dum[place] presence and promised to visit again later.    11/09/16 1400  Clinical Encounter Type  Visited With Patient  Visit Type Follow-up  Referral From [place];Other (Comment)

## 2016-11-09 NOTE — Brief Op Note (Signed)
11/09/2016  11:29 AM  PATIENT:  Kendra Downs  71 y.o. female  PRE-OPERATIVE DIAGNOSIS:  primary osteoarthritis right knee  POST-OPERATIVE DIAGNOSIS:  Osteoarthritis right knee  PROCEDURE:  Procedure(s): COMPUTER ASSISTED TOTAL KNEE ARTHROPLASTY (Right)  SURGEON:  Surgeon(s) and Role:    * Dereck Leep, MD - Primary  ASSISTANTS: Vance Peper, PA   ANESTHESIA:   spinal  EBL:  Total I/O In: 2250 [I.V.:2250] Out: 175 [Urine:150; Blood:25]  BLOOD ADMINISTERED:none  DRAINS: 2 medium drains to a reinfusion system   LOCAL MEDICATIONS USED:  MARCAINE    and OTHER Exparel  SPECIMEN:  No Specimen  DISPOSITION OF SPECIMEN:  N/A  COUNTS:  YES  TOURNIQUET:   111 minutes  DICTATION: .Dragon Dictation  PLAN OF CARE: Admit to inpatient   PATIENT DISPOSITION:  PACU - hemodynamically stable.   Delay start of Pharmacological VTE agent (>24hrs) due to surgical blood loss or risk of bleeding: yes

## 2016-11-09 NOTE — Progress Notes (Signed)
Pt recovering from surgery and eating lunch. McIntosh greeted Pt in name of another Bryant (andrew).  CH is available.   11/09/16 1355  Clinical Encounter Type  Visited With Patient;Family  Visit Type Initial  Referral From Chaplain

## 2016-11-09 NOTE — Anesthesia Procedure Notes (Signed)
Spinal  Patient location during procedure: OR Start time: 11/09/2016 7:28 AM End time: 11/09/2016 7:35 AM Staffing Anesthesiologist: Molli Barrows Resident/CRNA: Johnna Acosta Performed: resident/CRNA  Preanesthetic Checklist Completed: patient identified, site marked, surgical consent, pre-op evaluation, timeout performed, IV checked, risks and benefits discussed and monitors and equipment checked Spinal Block Patient position: sitting Prep: Betadine Patient monitoring: heart rate, continuous pulse ox, blood pressure and cardiac monitor Approach: midline Location: L4-5 Injection technique: single-shot Needle Needle type: Whitacre and Introducer  Needle gauge: 24 G Needle length: 9 cm Assessment Sensory level: T10 Additional Notes Negative paresthesia. Negative blood return. Positive free-flowing CSF. Expiration date of kit checked and confirmed. Patient tolerated procedure well, without complications.

## 2016-11-09 NOTE — Evaluation (Signed)
Physical Therapy Evaluation Patient Details Name: Kendra Downs MRN: [id number] DOB: 1946/06/30 Today's Date: 11/09/2016   History of Present Illness  Pt underwent R TKR without reported post-op complications. She is POD#0 at time of PT evaluation  Clinical Impression  Pt admitted with above diagnosis. Pt currently with functional limitations due to the deficits listed below (see PT Problem List).  Pt demonstrates good strength with RLE on POD#0 and is able to perform SLR and SAQ x 10 without assistance. She is CGA only for transfers and ambulation with rolling walker from bed to recliner. She demonstrates good stability with use of rolling walker for ambulation. Pt able to complete all bed exercises as instructed. AAROM is -5 to 60 degrees and is limited secondary to pain. Pt will be appropriate to return home with [facility] PT and family support at discharge. No DME needs. Pt will benefit from skilled PT services to address deficits in strength, balance, and mobility in order to return to full function at home.     Follow Up Recommendations Home health PT    Equipment Recommendations  None recommended by PT    Recommendations for Other Services       Precautions / Restrictions Precautions Precautions: Fall;Knee Precaution Booklet Issued: Yes (comment) Required Braces or Orthoses: Knee Immobilizer - Right Knee Immobilizer - Right: Discontinue once straight leg raise with < 10 degree lag Restrictions Weight Bearing Restrictions: Yes RLE Weight Bearing: Weight bearing as tolerated      Mobility  Bed Mobility Overal bed mobility: Needs Assistance Bed Mobility: Supine to Sit     Supine to sit: Min assist     General bed mobility comments: Pt prefers to pull up with therapists hand but is demonstrates adequate strength utilizing bed rails to perform bed mobility without external assist  Transfers Overall transfer level: Needs assistance Equipment used: Rolling walker (2  wheeled) Transfers: Sit to/from Stand Sit to Stand: Min guard         General transfer comment: Pt rises slowly from bed but is steady in standing with bilateral UE support on walker. Safe hand placement demonstrated without cues. Decreased weight shifting to RLE during transfer  Ambulation/Gait Ambulation/Gait assistance: Min guard Ambulation Distance (Feet): 5 Feet Assistive device: Rolling walker (2 wheeled) Gait Pattern/deviations: Decreased step length - left;Decreased stance time - right;Decreased weight shift to right Gait velocity: Decreased Gait velocity interpretation: <1.8 ft/sec, indicative of risk for recurrent falls General Gait Details: Pt demonstrates safe ambulation with use of rolling walker. Cues for proper sequencing with walker for ambulation and turns. Pt demonstrates decreased weight shifting to RLE during ambulation  Stairs            Wheelchair Mobility    Modified Rankin (Stroke Patients Only)       Balance Overall balance assessment: Needs assistance Sitting-balance support: No upper extremity supported Sitting balance-Leahy Scale: Good     Standing balance support: No upper extremity supported Standing balance-Leahy Scale: Fair Standing balance comment: Able to let go of walker to adjust without LOB but most of her weight on LLE                             Pertinent Vitals/Pain Pain Assessment: 0-10 Pain Score: 4  Pain Location: R knee Pain Descriptors / Indicators: Operative site guarding Pain Intervention(s): Monitored during session;Premedicated before session;Repositioned    Home Living Family/patient expects to be discharged to:: Private residence Living Arrangements:  Alone Available Help at Discharge: Family;Available 24 hours/day Type of Home: House Home Access: Stairs to enter Entrance Stairs-Rails: Right (post) Entrance Stairs-Number of Steps: 1 Home Layout: One level Home Equipment: Walker - 2 wheels;Cane -  quad;Bedside commode;Other (comment) (No shower chair but has BSC can use, no grab bars)      Prior Function Level of Independence: Independent         Comments: Full community ambulator without assistive device. Independent with ADLs/IADLs. Works full-time     Journalist, newspaper   Dominant Hand: Right    Extremity/Trunk Assessment   Upper Extremity Assessment Upper Extremity Assessment: Overall WFL for tasks assessed    Lower Extremity Assessment Lower Extremity Assessment: RLE deficits/detail RLE Deficits / Details: Pt reports full sensation to light touch in RLE. Full DF/PF. Able to perform full SLR and SAQ x 10 each without assist       Communication   Communication: No difficulties  Cognition Arousal/Alertness: Awake/alert Behavior During Therapy: WFL for tasks assessed/performed Overall Cognitive Status: Within Functional Limits for tasks assessed                      General Comments      Exercises Total Joint Exercises Ankle Circles/Pumps: AROM;Both;10 reps;Supine Quad Sets: Strengthening;Both;10 reps;Supine Gluteal Sets: Strengthening;Both;10 reps;Supine Short Arc Quad: Strengthening;Right;10 reps;Supine Heel Slides: Strengthening;Right;10 reps;Supine Hip ABduction/ADduction: Strengthening;Right;10 reps;Supine Straight Leg Raises: Strengthening;Right;10 reps;Supine Goniometric ROM: -5 to 60 degrees, AAROM, pain limited   Assessment/Plan    PT Assessment Patient needs continued PT services  PT Problem List Decreased strength;Decreased range of motion;Decreased balance;Pain          PT Treatment Interventions DME instruction;Gait training;Stair training;Therapeutic activities;Therapeutic exercise;Neuromuscular re-education;Balance training;Patient/family education;Manual techniques    PT Goals (Current goals can be found in the Care Plan section)  Acute Rehab PT Goals Patient Stated Goal: "move better." Return to prior level of function with  less pain PT Goal Formulation: With patient/family Time For Goal Achievement: 11/23/16 Potential to Achieve Goals: Good    Frequency BID   Barriers to discharge Decreased caregiver support Lives alone but will have 24/7 assist for the first 2 weeks following discharge    Co-evaluation               End of Session Equipment Utilized During Treatment: Gait belt Activity Tolerance: Patient tolerated treatment well Patient left: in chair;with call bell/phone within reach;with chair alarm set;Other (comment);with nursing/sitter in room (polar care in place, towel roll under heel, RN with autovac) Nurse Communication: Mobility status         Time: [id number] PT Time Calculation (min) (ACUTE ONLY): 30 min   Charges:   PT Evaluation $PT Eval Low Complexity: 1 Procedure PT Treatments $Therapeutic Exercise: 8-22 mins   PT G Codes:       Kendra Downs PT, DPT   Kendra Downs 11/09/2016, 4:19 PM

## 2016-11-09 NOTE — Anesthesia Procedure Notes (Signed)
Date/Time: 11/09/2016 7:30 AM Performed by: Johnna Acosta Pre-anesthesia Checklist: Patient identified, Emergency Drugs available, Suction available, Patient being monitored and Timeout performed Patient Re-evaluated:Patient Re-evaluated prior to inductionOxygen Delivery Method: Simple face mask

## 2016-11-09 NOTE — Op Note (Signed)
OPERATIVE NOTE  DATE OF SURGERY:  11/09/2016  PATIENT NAME:  ATHENA FRICKE   DOB: February 17, 1946  MRN: [id number]  PRE-OPERATIVE DIAGNOSIS: Degenerative arthrosis of the right knee, primary  POST-OPERATIVE DIAGNOSIS:  Same  PROCEDURE:  Right total knee arthroplasty using computer-assisted navigation  SURGEON:  Marciano Sequin. M.D.  ASSISTANT:  Vance Peper, PA (present and scrubbed throughout the case, critical for assistance with exposure, retraction, instrumentation, and closure)  ANESTHESIA: spinal  ESTIMATED BLOOD LOSS: 25 mL  FLUIDS REPLACED: 2250 mL of crystalloid  TOURNIQUET TIME: 111 minutes  DRAINS: 2 medium drains to a reinfusion system  SOFT TISSUE RELEASES: Anterior cruciate ligament, posterior cruciate ligament, deep medial collateral ligament, patellofemoral ligament  IMPLANTS UTILIZED: DePuy Attune size 5N posterior stabilized femoral component (cemented), size 5 rotating platform tibial component (cemented), 35 mm medialized dome patella (cemented), and a 7 mm stabilized rotating platform polyethylene insert.  INDICATIONS FOR SURGERY: ALLEY DUNKERSON is a 71 y.o. year old female with a long history of progressive knee pain. X-rays demonstrated severe degenerative changes in tricompartmental fashion. The patient had not seen any significant improvement despite conservative nonsurgical intervention. After discussion of the risks and benefits of surgical intervention, the patient expressed understanding of the risks benefits and agree with plans for total knee arthroplasty.   The risks, benefits, and alternatives were discussed at length including but not limited to the risks of infection, bleeding, nerve injury, stiffness, blood clots, the need for revision surgery, cardiopulmonary complications, among others, and they were willing to proceed.  PROCEDURE IN DETAIL: The patient was brought into the operating room and, after adequate spinal anesthesia was achieved, a  tourniquet was placed on the patient's upper thigh. The patient's knee and leg were cleaned and prepped with alcohol and DuraPrep and draped in the usual sterile fashion. A "timeout" was performed as per usual protocol. The lower extremity was exsanguinated using an Esmarch, and the tourniquet was inflated to 300 mmHg. An anterior longitudinal incision was made followed by a standard mid vastus approach. The deep fibers of the medial collateral ligament were elevated in a subperiosteal fashion off of the medial flare of the tibia so as to maintain a continuous soft tissue sleeve. The patella was subluxed laterally and the patellofemoral ligament was incised. Inspection of the knee demonstrated severe degenerative changes with full-thickness loss of articular cartilage. Osteophytes were debrided using a rongeur. Anterior and posterior cruciate ligaments were excised. Two 4.0 mm Schanz pins were inserted in the femur and into the tibia for attachment of the array of trackers used for computer-assisted navigation. Hip center was identified using a circumduction technique. Distal landmarks were mapped using the computer. The distal femur and proximal tibia were mapped using the computer. The distal femoral cutting guide was positioned using computer-assisted navigation so as to achieve a 5 distal valgus cut. The femur was sized and it was felt that a size 5N femoral component was appropriate. A size 5 femoral cutting guide was positioned and the anterior cut was performed and verified using the computer. This was followed by completion of the posterior and chamfer cuts. Femoral cutting guide for the central box was then positioned in the center box cut was performed.  Attention was then directed to the proximal tibia. Medial and lateral menisci were excised. The extramedullary tibial cutting guide was positioned using computer-assisted navigation so as to achieve a 0 varus-valgus alignment and 3 posterior slope.  The cut was performed and verified using the computer.  The proximal tibia was sized and it was felt that a size 5 tibial tray was appropriate. Tibial and femoral trials were inserted followed by insertion of a 7 mm polyethylene insert. This allowed for excellent mediolateral soft tissue balancing both in flexion and in full extension. Finally, the patella was cut and prepared so as to accommodate a 35 mm medialized dome patella. A patella trial was placed and the knee was placed through a range of motion with excellent patellar tracking appreciated. The femoral trial was removed after debridement of posterior osteophytes. The central post-hole for the tibial component was reamed followed by insertion of a keel punch. Tibial trials were then removed. Cut surfaces of bone were irrigated with copious amounts of normal saline with antibiotic solution using pulsatile lavage and then suctioned dry. Polymethylmethacrylate cement with gentamicin was prepared in the usual fashion using a vacuum mixer. Cement was applied to the cut surface of the proximal tibia as well as along the undersurface of a size 5 rotating platform tibial component. Tibial component was positioned and impacted into place. Excess cement was removed using [employer] fast streamer. Cement was then applied to the cut surfaces of the femur as well as along the posterior flanges of the size 5N femoral component. The femoral component was positioned and impacted into place. Excess cement was removed using [employer] fast streamer. A 7 mm polyethylene trial was inserted and the knee was brought into full extension with steady axial compression applied. Finally, cement was applied to the backside of a 35 mm medialized dome patella and the patellar component was positioned and patellar clamp applied. Excess cement was removed using [employer] fast streamer. After adequate curing of the cement, the tourniquet was deflated after a total tourniquet time of 111 minutes. Hemostasis was  achieved using electrocautery. The knee was irrigated with copious amounts of normal saline with antibiotic solution using pulsatile lavage and then suctioned dry. 20 mL of 1.3% Exparel and 60 mL of 0.25% Marcaine in 40 mL of normal saline was injected along the posterior capsule, medial and lateral gutters, and along the arthrotomy site. A 7 mm stabilized rotating platform polyethylene insert was inserted and the knee was placed through a range of motion with excellent mediolateral soft tissue balancing appreciated and excellent patellar tracking noted. 2 medium drains were placed in the wound bed and brought out through separate stab incisions to be attached to a reinfusion system. The medial parapatellar portion of the incision was reapproximated using interrupted sutures of #1 Vicryl. Subcutaneous tissue was approximated in layers using first #0 Vicryl followed #2-0 Vicryl. The skin was approximated with skin staples. A sterile dressing was applied.  The patient tolerated the procedure well and was transported to the recovery room in stable condition.    Bobetta Korf P. Holley Bouche., M.D.

## 2016-11-09 NOTE — Anesthesia Preprocedure Evaluation (Signed)
Anesthesia Evaluation  Patient identified by MRN, date of birth, ID band Patient awake    Reviewed: Allergy & Precautions, H&P , NPO status , Patient's Chart, lab work & pertinent test results, reviewed documented beta blocker date and time   Airway Mallampati: II  TM Distance: >3 FB Neck ROM: full    Dental  (+) Teeth Intact   Pulmonary neg pulmonary ROS, former smoker,    Pulmonary exam normal        Cardiovascular hypertension, + Peripheral Vascular Disease  negative cardio ROS Normal cardiovascular exam Rhythm:regular Rate:Normal     Neuro/Psych negative neurological ROS  negative psych ROS   GI/Hepatic negative GI ROS, Neg liver ROS,   Endo/Other  negative endocrine ROSdiabetes  Renal/GU negative Renal ROS  negative genitourinary   Musculoskeletal   Abdominal   Peds  Hematology negative hematology ROS (+)   Anesthesia Other Findings Past Medical History: No date: Arthritis No date: Cancer ([place])     Comment: right arm - skin melanoma No date: Depression     Comment: with loss of son No date: Diabetes mellitus without complication ([place])     Comment: prediabetic 2014: Hyperlipidemia No date: Hypertension No date: Pain     Comment: right knee No date: PVD (peripheral vascular disease) ([place]) Past Surgical History: No date: BREAST CYST ASPIRATION Right     Comment: cyst october 2013: LASER ABLATION     Comment: venous ulcer No date: SKIN CANCER EXCISION   Reproductive/Obstetrics negative OB ROS                             Anesthesia Physical Anesthesia Plan  ASA: II  Anesthesia Plan: Regional   Post-op Pain Management:    Induction:   Airway Management Planned:   Additional Equipment:   Intra-op Plan:   Post-operative Plan:   Informed Consent: I have reviewed the patients History and Physical, chart, labs and discussed the procedure including the risks, benefits  and alternatives for the proposed anesthesia with the patient or authorized representative who has indicated his/her understanding and acceptance.   Dental Advisory Given  Plan Discussed with: CRNA  Anesthesia Plan Comments:         Anesthesia Quick Evaluation

## 2016-11-09 NOTE — H&P (Signed)
The patient has been re-examined, and the chart reviewed, and there have been no interval changes to the documented history and physical.    The risks, benefits, and alternatives have been discussed at length. The patient expressed understanding of the risks benefits and agreed with plans for surgical intervention.  James P. Hooten, Jr. M.D.    

## 2016-11-10 LAB — BASIC METABOLIC PANEL
Anion gap: 5 (ref 5–15)
BUN: 14 mg/dL (ref 6–20)
CHLORIDE: 107 mmol/L (ref 101–111)
CO2: 27 mmol/L (ref 22–32)
CREATININE: 0.76 mg/dL (ref 0.44–1.00)
Calcium: 7.9 mg/dL — ABNORMAL LOW (ref 8.9–10.3)
Glucose, Bld: 118 mg/dL — ABNORMAL HIGH (ref 65–99)
POTASSIUM: 3.3 mmol/L — AB (ref 3.5–5.1)
SODIUM: 139 mmol/L (ref 135–145)

## 2016-11-10 LAB — GLUCOSE, CAPILLARY
GLUCOSE-CAPILLARY: 133 mg/dL — AB (ref 65–99)
GLUCOSE-CAPILLARY: 119 mg/dL — AB (ref 65–99)
Glucose-Capillary: 148 mg/dL — ABNORMAL HIGH (ref 65–99)
Glucose-Capillary: 153 mg/dL — ABNORMAL HIGH (ref 65–99)

## 2016-11-10 LAB — CBC
HCT: 30.5 % — ABNORMAL LOW (ref 35.0–47.0)
Hemoglobin: 10.1 g/dL — ABNORMAL LOW (ref 12.0–16.0)
MCH: 30.7 pg (ref 26.0–34.0)
MCHC: 33 g/dL (ref 32.0–36.0)
MCV: 93 fL (ref 80.0–100.0)
PLATELETS: 151 10*3/uL (ref 150–440)
RBC: 3.28 MIL/uL — AB (ref 3.80–5.20)
RDW: 15.3 % — ABNORMAL HIGH (ref 11.5–14.5)
WBC: 7 10*3/uL (ref 3.6–11.0)

## 2016-11-10 MED ORDER — POTASSIUM CHLORIDE 20 MEQ PO PACK
20.0000 meq | PACK | Freq: Three times a day (TID) | ORAL | Status: AC
  Administered 2016-11-10 (×3): 20 meq via ORAL
  Filled 2016-11-10 (×3): qty 1

## 2016-11-10 NOTE — Care Management Note (Signed)
Case Management Note  Patient Details  Name: TWISHA VANPELT MRN: [id number] Date of Birth: 01-24-1946  Subjective/Objective:  POD # 1 right TKA. Met with patient and daughter at bedside. Daughter and grand daughter will be staying with patient for the first 2 weeks. She has a walker and a cane. No DME needs. [facility] [phone]). Called Lovenox 40 mg # 14 no refills . Offered choice of home health agencies. She chooses [facility]. PCP is [place] [facility].                   Action/Plan:  [facility] for [facility] PT. Lovenox called in, No DME needs  Expected Discharge Date:   11/11/2016               Expected Discharge Plan:  [place]  In-House Referral:     Discharge planning Services  CM Consult  Post Acute Care Choice:  Home Health Choice offered to:  Patient  DME Arranged:    DME Agency:     HH Arranged:  [facility] [place]:  [facility] (now [facility])  Status of Service:  In process, will continue to follow  If discussed at Long Length of Stay Meetings, dates discussed:    Additional Comments:  Jolly Mango, RN 11/10/2016, 11:23 AM

## 2016-11-10 NOTE — Progress Notes (Signed)
Physical Therapy Treatment Patient Details Name: Kendra Downs MRN: [id number] DOB: 06/07/1946 Today's Date: 11/10/2016    History of Present Illness Pt underwent R TKR without reported post-op complications, currently POD #1, progressing well.    PT Comments    Frequent cuing/encoruagement for active use of R LE with all movement transitions; however, very responsive to all cuing from therapist, works diligently to integrate cuing.  Continued progression of all mobility, requiring less assist, beginning to move with greater confidence/fluidity. Will plan to complete stair training next session prior to discharge home.   Follow Up Recommendations  Home health PT     Equipment Recommendations  None recommended by PT    Recommendations for Other Services       Precautions / Restrictions Precautions Precautions: Fall;Knee Precaution Booklet Issued: Yes (comment) Restrictions Weight Bearing Restrictions: Yes RLE Weight Bearing: Weight bearing as tolerated    Mobility  Bed Mobility Overal bed mobility: Modified Independent Bed Mobility: Sit to Supine     Supine to sit: Supervision Sit to supine: Modified independent (Device/Increase time)   General bed mobility comments: able to negotiate R LE over edge of bed without therapist assist  Transfers Overall transfer level: Needs assistance Equipment used: Rolling walker (2 wheeled) Transfers: Sit to/from Stand Sit to Stand: Supervision         General transfer comment: encouraged for R knee flexion, progression towards symmetrical foot placement/WBing with movement transitions  Ambulation/Gait Ambulation/Gait assistance: Min guard;Supervision Ambulation Distance (Feet): 220 Feet Assistive device: Rolling walker (2 wheeled)       General Gait Details: reciprocal stepping pattern with increasing cadence and gait speed; cuing for R LE heel strike/toe off and increased R knee mobility   Stairs             Wheelchair Mobility    Modified Rankin (Stroke Patients Only)       Balance Overall balance assessment: Needs assistance Sitting-balance support: No upper extremity supported;Feet supported Sitting balance-Leahy Scale: Normal     Standing balance support: Bilateral upper extremity supported Standing balance-Leahy Scale: Good                      Cognition Arousal/Alertness: Awake/alert Behavior During Therapy: WFL for tasks assessed/performed Overall Cognitive Status: Within Functional Limits for tasks assessed                      Exercises Total Joint Exercises Goniometric ROM: 7-81 degrees, act assist ROM Other Exercises Other Exercises: Toilet transfer, ambulatory with RW, close sup; standing balance with RW, close sup. Other Exercises: Sit/stand x5 from recliner with RW, cga/close sup-emphasis on hand/foot placement, active use of R LE; encouraged for R foot flat throughout movement transition and increased eccentric control with stand to sit    General Comments        Pertinent Vitals/Pain Pain Assessment: 0-10 Pain Score: 5  Pain Location: Right Knee Pain Descriptors / Indicators: Operative site guarding;Aching;Grimacing;Guarding Pain Intervention(s): Limited activity within patient's tolerance;Monitored during session;Repositioned;RN gave pain meds during session    Home Living                      Prior Function            PT Goals (current goals can now be found in the care plan section) Acute Rehab PT Goals Patient Stated Goal: To return home. PT Goal Formulation: With patient/family Time For Goal Achievement: 11/23/16 Potential to  Achieve Goals: Good Progress towards PT goals: Progressing toward goals    Frequency    BID      PT Plan Current plan remains appropriate    Co-evaluation             End of Session Equipment Utilized During Treatment: Gait belt Activity Tolerance: Patient tolerated treatment  well Patient left: in bed;with call bell/phone within reach;with bed alarm set     Time: [id number] PT Time Calculation (min) (ACUTE ONLY): 23 min  Charges:  $Gait Training: 8-22 mins $Therapeutic Exercise: 8-22 mins $Therapeutic Activity: 8-22 mins                    G Codes:      [name] H. Owens Shark, PT, DPT, NCS 11/10/16, 5:12 PM [phone]

## 2016-11-10 NOTE — Evaluation (Signed)
Occupational Therapy Evaluation Patient Details Name: Kendra Downs MRN: [id number] DOB: May 29, 1946 Today's Date: 11/10/2016    History of Present Illness Pt. is a 71 y.o. female who was admitted to [facility] for a right TKR   Clinical Impression   Pt. Is a 71 y.o. Female who was admitted for a right TKR. Pt presents with limited ROM, Pain, weakness, and impaired functional mobility which hinder her ability to complete ADL and IADL tasks. Pt. could benefit from skilled OT services to review A/E use for LE ADLs, to review necessary home modifications, and to improve functional mobility for ADL/IADLs in order to work towards regaining Independence with ADL/IADLs.     Follow Up Recommendations  No OT follow up    Equipment Recommendations       Recommendations for Other Services       Precautions / Restrictions Restrictions Weight Bearing Restrictions: Yes RLE Weight Bearing: Weight bearing as tolerated                                                     ADL Overall ADL's : Needs assistance/impaired Eating/Feeding: Set up   Grooming: Set up                                 General ADL Comments: Pt. education was provided about A/E use for LE ADLs     Vision     Perception     Praxis      Pertinent Vitals/Pain Pain Assessment: 0-10 Pain Score: 4  Pain Location: Right Knee Pain Intervention(s): Monitored during session;Premedicated before session;Repositioned     Hand Dominance Right   Extremity/Trunk Assessment Upper Extremity Assessment Upper Extremity Assessment: Overall WFL for tasks assessed           Communication Communication Communication: No difficulties   Cognition Arousal/Alertness: Awake/alert Behavior During Therapy: WFL for tasks assessed/performed Overall Cognitive Status: Within Functional Limits for tasks assessed                     General Comments       Exercises       Shoulder  Instructions      Home Living Family/patient expects to be discharged to:: Private residence Living Arrangements: Alone Available Help at Discharge: Family;Available 24 hours/day Type of Home: House Home Access: Stairs to enter [employer] of Steps: 1 Entrance Stairs-Rails: Right Home Layout: One level     Bathroom Shower/Tub: Teacher, early years/pre:  (Has a BSCommode)     Home Equipment: Environmental consultant - 2 wheels;Cane - quad;Bedside commode          Prior Functioning/Environment Level of Independence: Independent        Comments: Independent with ADls, Driving        OT Problem List: Decreased strength;Decreased range of motion;Pain;Decreased knowledge of use of DME or AE   OT Treatment/Interventions: DME and/or AE instruction;Self-care/ADL training;Patient/family education    OT Goals(Current goals can be found in the care plan section) Acute Rehab OT Goals Patient Stated Goal: To return home. OT Goal Formulation: With patient Potential to Achieve Goals: Good  OT Frequency: Min 1X/week   Barriers to D/C:            Co-evaluation  End of Session    Activity Tolerance: Patient tolerated treatment well Patient left: in bed;with call bell/phone within reach;with bed alarm set   Time: [id number] OT Time Calculation (min): 15 min Charges:  OT General Charges $OT Visit: 1 Procedure OT Evaluation $OT Eval Low Complexity: 1 Procedure G-Codes:    Harrel Carina, MS, OTR/L 11/10/2016, 10:47 AM

## 2016-11-10 NOTE — Progress Notes (Signed)
Physical Therapy Treatment Patient Details Name: Kendra Downs MRN: [id number] DOB: 12-11-1945 Today's Date: 11/10/2016    History of Present Illness Pt underwent R TKR without reported post-op complications, currently POD #1, progressing well.    PT Comments    Excellent participation/effort with all therex/theract. Able to complete gait around nursing station, requiring no greater than cga/close sup for all mobility efforts.  Good R knee strength/control, progressive increase in ROM to R knee; min cuing for increased flex/ext with gait trials.   Follow Up Recommendations  Home health PT     Equipment Recommendations  None recommended by PT    Recommendations for Other Services       Precautions / Restrictions Precautions Precautions: Fall;Knee Restrictions Weight Bearing Restrictions: Yes RLE Weight Bearing: Weight bearing as tolerated    Mobility  Bed Mobility Overal bed mobility: Needs Assistance Bed Mobility: Supine to Sit     Supine to sit: Supervision     General bed mobility comments: able to negotiate R LE over edge of bed without therapist assist  Transfers Overall transfer level: Needs assistance Equipment used: Rolling walker (2 wheeled) Transfers: Sit to/from Stand Sit to Stand: Min guard         General transfer comment: cuing for hand placement with movement transitions; slow and deliberate, but with good control. Maintains R LE slightly anterior to BOS  Ambulation/Gait Ambulation/Gait assistance: Min guard Ambulation Distance (Feet): 220 Feet Assistive device: Rolling walker (2 wheeled)       General Gait Details: step to progressing to step through gait pattern; min cuing for increased stance time/weight acceptance to R LE.  slow, guarded cadence but without buckling or LOB   Stairs            Wheelchair Mobility    Modified Rankin (Stroke Patients Only)       Balance Overall balance assessment: Needs  assistance Sitting-balance support: No upper extremity supported;Feet supported Sitting balance-Leahy Scale: Normal     Standing balance support: Bilateral upper extremity supported Standing balance-Leahy Scale: Good                      Cognition Arousal/Alertness: Awake/alert Behavior During Therapy: WFL for tasks assessed/performed Overall Cognitive Status: Within Functional Limits for tasks assessed                      Exercises Total Joint Exercises Goniometric ROM: 7-81 degrees, act assist ROM Other Exercises Other Exercises: Seated LE therex, 1x12, AROM with emphasis on R knee flex/ext.  Good R quad control; good effort with all therex Other Exercises: Toilet transfer, ambulatory with RW, cga to/from [facility]. Static stance and functional reach (approx 6") with contralateral UE support as needed, cga/close sup.    General Comments        Pertinent Vitals/Pain Pain Assessment: 0-10 Pain Score: 3  Pain Location: Right Knee Pain Descriptors / Indicators: Operative site guarding;Aching;Grimacing;Guarding Pain Intervention(s): Limited activity within patient's tolerance;Monitored during session;Premedicated before session;Repositioned    Home Living                      Prior Function            PT Goals (current goals can now be found in the care plan section) Acute Rehab PT Goals Patient Stated Goal: To return home. PT Goal Formulation: With patient/family Time For Goal Achievement: 11/23/16 Potential to Achieve Goals: Good Progress towards PT goals: Progressing toward  goals    Frequency    BID      PT Plan Current plan remains appropriate    Co-evaluation             End of Session Equipment Utilized During Treatment: Gait belt Activity Tolerance: Patient tolerated treatment well Patient left: in chair;with call bell/phone within reach     Time: 1008-1040 PT Time Calculation (min) (ACUTE ONLY): 32 min  Charges:  $Gait  Training: 8-22 mins $Therapeutic Exercise: 8-22 mins                    G Codes:       Mildred Bollard H. Owens Shark, PT, DPT, NCS 11/10/16, 5:06 PM [phone]

## 2016-11-10 NOTE — Progress Notes (Signed)
   Subjective: 1 Day Post-Op Procedure(s) (LRB): COMPUTER ASSISTED TOTAL KNEE ARTHROPLASTY (Right) Patient reports pain as mild.   Patient is well, and has had no acute complaints or problems We will start therapy today.  Plan is to go Home after hospital stay. no nausea and no vomiting Patient denies any chest pains or shortness of breath. Objective: Vital signs in last 24 hours: Temp:  [96.9 F (36.1 C)-99 F (37.2 C)] 98.1 F (36.7 C) (01/09 0344) Pulse Rate:  [42-106] 95 (01/09 0344) Resp:  [12-18] 18 (01/09 0344) BP: (98-137)/(51-81) 105/52 (01/09 0344) SpO2:  [94 %-100 %] 95 % (01/09 0344) FiO2 (%):  [28 %] 28 % (01/08 1237) Weight:  [98.9 kg (218 lb 0.6 oz)] 98.9 kg (218 lb 0.6 oz) (01/08 1301) Heels are non tender and elevated off the bed using rolled towels and bone foam  Intake/Output from previous day: 01/08 0701 - 01/09 0700 In: 5291 [P.O.:360; I.V.:3825; IV Piggyback:810] Out: [id number] [Urine:1000; Drains:620; Blood:25] Intake/Output this shift: No intake/output data recorded.   Recent Labs  11/10/16 0331  HGB 10.1*    Recent Labs  11/10/16 0331  WBC 7.0  RBC 3.28*  HCT 30.5*  PLT 151    Recent Labs  11/10/16 0331  NA 139  K 3.3*  CL 107  CO2 27  BUN 14  CREATININE 0.76  GLUCOSE 118*  CALCIUM 7.9*   No results for input(s): LABPT, INR in the last 72 hours.  EXAM General - Patient is Alert, Appropriate and Oriented Extremity - Neurologically intact Neurovascular intact Sensation intact distally Intact pulses distally Dorsiflexion/Plantar flexion intact No cellulitis present Compartment soft Dressing - dressing C/D/I Motor Function - intact, moving foot and toes well on exam. Able to do straight leg raises on her own  Past Medical History:  Diagnosis Date  . Arthritis   . Cancer ([place])    right arm - skin melanoma  . Depression    with loss of son  . Diabetes mellitus without complication (HCC)    prediabetic  . Hyperlipidemia  2014  . Hypertension   . Pain    right knee  . PVD (peripheral vascular disease) (HCC)     Assessment/Plan: 1 Day Post-Op Procedure(s) (LRB): COMPUTER ASSISTED TOTAL KNEE ARTHROPLASTY (Right) Active Problems:   S/P total knee arthroplasty  Estimated body mass index is 36.28 kg/m as calculated from the following:   Height as of this encounter: 5\' 5"  (1.651 m).   Weight as of this encounter: 98.9 kg (218 lb 0.6 oz). Advance diet Up with therapy D/C IV fluids Plan for discharge tomorrow Discharge home with home health  Labs: Were reviewed. Potassium 3.3. We'll add Klor-Con DVT Prophylaxis - Lovenox, Foot Pumps and TED hose Weight-Bearing as tolerated to right leg D/C O2 and Pulse OX and try on Room Air  Begin working on having a bowel movement  Jon R. [place]inco [place] 11/10/2016, 7:10 AM

## 2016-11-10 NOTE — Discharge Summary (Signed)
Physician Discharge Summary  Patient ID: Kendra Downs MRN: [id number] DOB/AGE: 01/03/1946 71 y.o.  Admit date: 11/09/2016 Discharge date: 11/11/2016  Admission Diagnoses:  primary osteoarthritis right knee   Discharge Diagnoses: Patient Active Problem List   Diagnosis Date Noted  . S/P total knee arthroplasty 11/09/2016  . Diabetes type 2, controlled ([place]) 08/12/2015  . Arthritis of knee, degenerative 05/18/2014    Past Medical History:  Diagnosis Date  . Arthritis   . Cancer ([place])    right arm - skin melanoma  . Depression    with loss of son  . Diabetes mellitus without complication (HCC)    prediabetic  . Hyperlipidemia 2014  . Hypertension   . Pain    right knee  . PVD (peripheral vascular disease) ([place])      Transfusion: No transfusions given doing this admission   Consultants (if any):  case management for home health assistance  Discharged Condition: Improved  Hospital Course: Kendra Downs is an 71 y.o. female who was admitted 11/09/2016 with a diagnosis of degenerative arthrosis right knee and went to the operating room on 11/09/2016 and underwent the above named procedures.    Surgeries:Procedure(s): COMPUTER ASSISTED TOTAL KNEE ARTHROPLASTY on 11/09/2016  PRE-OPERATIVE DIAGNOSIS: Degenerative arthrosis of the right knee, primary  POST-OPERATIVE DIAGNOSIS:  Same  PROCEDURE:  Right total knee arthroplasty using computer-assisted navigation  SURGEON:  Marciano Sequin. M.D.  ASSISTANT:  Vance Peper, PA (present and scrubbed throughout the case, critical for assistance with exposure, retraction, instrumentation, and closure)  ANESTHESIA: spinal  ESTIMATED BLOOD LOSS: 25 mL  FLUIDS REPLACED: 2250 mL of crystalloid  TOURNIQUET TIME: 111 minutes  DRAINS: 2 medium drains to a reinfusion system  SOFT TISSUE RELEASES: Anterior cruciate ligament, posterior cruciate ligament, deep medial collateral ligament, patellofemoral  ligament  IMPLANTS UTILIZED: DePuy Attune size 5N posterior stabilized femoral component (cemented), size 5 rotating platform tibial component (cemented), 35 mm medialized dome patella (cemented), and a 7 mm stabilized rotating platform polyethylene insert.  INDICATIONS FOR SURGERY: Kendra Downs is a 71 y.o. year old female with a long history of progressive knee pain. X-rays demonstrated severe degenerative changes in tricompartmental fashion. The patient had not seen any significant improvement despite conservative nonsurgical intervention. After discussion of the risks and benefits of surgical intervention, the patient expressed understanding of the risks benefits and agree with plans for total knee arthroplasty.   The risks, benefits, and alternatives were discussed at length including but not limited to the risks of infection, bleeding, nerve injury, stiffness, blood clots, the need for revision surgery, cardiopulmonary complications, among others, and they were willing to proceed. Patient tolerated the surgery well. No complications .Patient was taken to PACU where she was stabilized and then transferred to the orthopedic floor.  Patient started on Lovenox 30 mg q 12 hrs. Foot pumps applied bilaterally at 80 mm hgb. Heels elevated off bed with rolled towels. No evidence of DVT. Calves non tender. Negative Homan. Physical therapy started on day #1 for gait training and transfer with OT starting on  day #1 for ADL and assisted devices. Patient has done well with therapy. Ambulated greater than 200 feet upon being discharged. Was able to go up and down 4 steps independently and safely  Patient's IV and Foley were discontinued on day #1 with Hemovac being discontinued on day #2 along with dressing change prior to being discharged.   She was given perioperative antibiotics:  Anti-infectives    Start  Dose/Rate Route Frequency Ordered Stop   11/09/16 1245  ceFAZolin (ANCEF) IVPB 2g/100  mL premix     2 g 200 mL/hr over 30 Minutes Intravenous Every 6 hours 11/09/16 1236 11/10/16 0700   11/09/16 0620  ceFAZolin (ANCEF) 2-4 GM/100ML-% IVPB    Comments:  Rexanne Mano: cabinet override      11/09/16 0620 11/09/16 0752   11/09/16 0600  ceFAZolin (ANCEF) IVPB 2g/100 mL premix     2 g 200 mL/hr over 30 Minutes Intravenous On call to O.R. 11/08/16 2318 11/09/16 0802    .  She was fitted with AV 1 compression foot pump devices, instructed on heel pumps, early ambulation, and TED stockings bilaterally for DVT prophylaxis.  She benefited maximally from the hospital stay and there were no complications.    Recent vital signs:  Vitals:   11/09/16 2350 11/10/16 0344  BP: (!) 105/51 (!) 105/52  Pulse: (!) 59 95  Resp: 18 18  Temp: 97.8 F (36.6 C) 98.1 F (36.7 C)    Recent laboratory studies:  Lab Results  Component Value Date   HGB 10.1 (L) 11/10/2016   HGB 12.9 10/28/2016   HGB 12.8 12/26/2012   Lab Results  Component Value Date   WBC 7.0 11/10/2016   PLT 151 11/10/2016   Lab Results  Component Value Date   INR 1.04 10/28/2016   Lab Results  Component Value Date   NA 139 11/10/2016   K 3.3 (L) 11/10/2016   CL 107 11/10/2016   CO2 27 11/10/2016   BUN 14 11/10/2016   CREATININE 0.76 11/10/2016   GLUCOSE 118 (H) 11/10/2016    Discharge Medications:   Allergies as of 11/11/2016   No Known Allergies     Medication List    STOP taking these medications   etodolac 500 MG tablet Commonly known as:  LODINE   naproxen sodium 220 MG tablet Commonly known as:  ANAPROX     TAKE these medications   COENZYME Q10-OMEGA 3 FATTY ACD PO Take 2 capsules by mouth daily.   enoxaparin 40 MG/0.4ML injection Commonly known as:  LOVENOX Inject 0.4 mLs (40 mg total) into the skin daily.   hydrochlorothiazide 12.5 MG capsule Commonly known as:  MICROZIDE Take 12.5 mg by mouth daily.   oxyCODONE 5 MG immediate release tablet Commonly known as:  Oxy  IR/ROXICODONE Take 1-2 tablets (5-10 mg total) by mouth every 4 (four) hours as needed for severe pain or breakthrough pain.   quinapril-hydrochlorothiazide 20-12.5 MG tablet Commonly known as:  ACCURETIC Take 1 tablet by mouth daily.   traMADol 50 MG tablet Commonly known as:  ULTRAM Take 1-2 tablets (50-100 mg total) by mouth every 4 (four) hours as needed for moderate pain.   Vitamin D (Ergocalciferol) 50000 units Caps capsule Commonly known as:  DRISDOL Take 50,000 Units by mouth every 7 (seven) days. Takes either Sat and Sun            Durable Medical Equipment        Start     Ordered   11/09/16 1237  DME Walker rolling  Once    Question:  Patient needs a walker to treat with the following condition  Answer:  Total knee replacement status   11/09/16 1236   11/09/16 1237  DME Bedside commode  Once    Question:  Patient needs a bedside commode to treat with the following condition  Answer:  Total knee replacement status   11/09/16 1236  Diagnostic Studies: Nm Myocar Multi W/spect W/wall Motion / Ef  Result Date: 11/06/2016  Blood pressure demonstrated a normal response to exercise.  There was no ST segment deviation noted during stress.  Defect 1: There is a small defect of mild severity present in the apex location.  This is a low risk study.  The left ventricular ejection fraction is normal (55-65%).    Dg Knee Right Port  Result Date: 11/09/2016 CLINICAL DATA:  Post- operative portable radiographs following right total knee joint replacement. EXAM: PORTABLE RIGHT KNEE - 1-2 VIEW COMPARISON:  None in PACs FINDINGS: The patient has undergone right total knee arthroplasty. Radiographic positioning of the prosthetic components is good. The interface with the native bone appears normal. There are screw tracks in the distal femoral metadiaphysis and proximal tibial metadiaphysis. Surgical drain lines are present. IMPRESSION: No immediate complication following right  total knee joint replacement. Electronically Signed   By: David  Martinique M.D.   On: 11/09/2016 12:01    Disposition: Final discharge disposition not confirmed    Follow-up Information    Majesti Gambrell R., PA On 11/24/2016.   Specialty:  Physician Assistant Why:  at 1:15pm Contact information: [place] [address] [phone]        Dereck Leep, MD On 12/22/2016.   Specialty:  Orthopedic Surgery Why:  at 9:15am Contact information: [place] [address] [phone]            Signed: Watt Climes 11/10/2016, 7:35 AM

## 2016-11-10 NOTE — Discharge Instructions (Signed)

## 2016-11-10 NOTE — Anesthesia Postprocedure Evaluation (Signed)
Anesthesia Post Note  Patient: Kendra Downs  Procedure(s) Performed: Procedure(s) (LRB): COMPUTER ASSISTED TOTAL KNEE ARTHROPLASTY (Right)  Patient location during evaluation: PACU Anesthesia Type: Regional Level of consciousness: awake and alert Pain management: pain level controlled Vital Signs Assessment: post-procedure vital signs reviewed and stable Respiratory status: spontaneous breathing, nonlabored ventilation, respiratory function stable and patient connected to nasal cannula oxygen Cardiovascular status: blood pressure returned to baseline and stable Postop Assessment: no signs of nausea or vomiting Anesthetic complications: no     Last Vitals:  Vitals:   11/10/16 0344 11/10/16 0736  BP: (!) 105/52 104/80  Pulse: 95 (!) 57  Resp: 18 18  Temp: 36.7 C 36.4 C    Last Pain:  Vitals:   11/10/16 0935  TempSrc:   PainSc: [place]

## 2016-11-10 NOTE — Progress Notes (Signed)
Clinical Social Worker (CSW) received SNF consult. PT is recommending home health. RN case manager is aware of above. Please reconsult if future social work needs arise. CSW signing off.   Kian Gamarra, LCSW [phone] 

## 2016-11-11 LAB — CBC
HEMATOCRIT: 28.6 % — AB (ref 35.0–47.0)
Hemoglobin: 9.7 g/dL — ABNORMAL LOW (ref 12.0–16.0)
MCH: 31 pg (ref 26.0–34.0)
MCHC: 33.8 g/dL (ref 32.0–36.0)
MCV: 91.5 fL (ref 80.0–100.0)
PLATELETS: 153 10*3/uL (ref 150–440)
RBC: 3.13 MIL/uL — ABNORMAL LOW (ref 3.80–5.20)
RDW: 14.8 % — AB (ref 11.5–14.5)
WBC: 5.6 10*3/uL (ref 3.6–11.0)

## 2016-11-11 LAB — BASIC METABOLIC PANEL
Anion gap: 6 (ref 5–15)
BUN: 14 mg/dL (ref 6–20)
CALCIUM: 8.3 mg/dL — AB (ref 8.9–10.3)
CO2: 28 mmol/L (ref 22–32)
CREATININE: 0.61 mg/dL (ref 0.44–1.00)
Chloride: 106 mmol/L (ref 101–111)
Glucose, Bld: 123 mg/dL — ABNORMAL HIGH (ref 65–99)
Potassium: 3.8 mmol/L (ref 3.5–5.1)
SODIUM: 140 mmol/L (ref 135–145)

## 2016-11-11 LAB — GLUCOSE, CAPILLARY
GLUCOSE-CAPILLARY: 141 mg/dL — AB (ref 65–99)
Glucose-Capillary: 133 mg/dL — ABNORMAL HIGH (ref 65–99)
Glucose-Capillary: 144 mg/dL — ABNORMAL HIGH (ref 65–99)

## 2016-11-11 MED ORDER — ENOXAPARIN SODIUM 40 MG/0.4ML ~~LOC~~ SOLN: 40.0000 mg | SUBCUTANEOUS | 0 refills | Status: DC

## 2016-11-11 MED ORDER — TRAMADOL HCL 50 MG PO TABS: 50.0000 mg | ORAL_TABLET | ORAL | 0 refills | Status: DC | PRN

## 2016-11-11 MED ORDER — OXYCODONE HCL 5 MG PO TABS: 5.0000 mg | ORAL_TABLET | ORAL | 0 refills | Status: DC | PRN

## 2016-11-11 MED ORDER — LACTULOSE 10 GM/15ML PO SOLN
20.0000 g | Freq: Every day | ORAL | Status: DC | PRN
  Administered 2016-11-11: 20 g via ORAL
  Filled 2016-11-11: qty 30

## 2016-11-11 NOTE — Progress Notes (Signed)
   Subjective: 2 Days Post-Op Procedure(s) (LRB): COMPUTER ASSISTED TOTAL KNEE ARTHROPLASTY (Right) Patient reports pain as 3 on 0-10 scale.   Patient is well, and has had no acute complaints or problems Continue with physical therapy today. Needs to do steps prior to being discharged Plan is to go Home after hospital stay. no nausea and no vomiting Patient denies any chest pains or shortness of breath. Objective: Vital signs in last 24 hours: Temp:  [97.6 F (36.4 C)-98.3 F (36.8 C)] 98.2 F (36.8 C) (01/10 0431) Pulse Rate:  [57-74] 74 (01/10 0431) Resp:  [16-20] 19 (01/10 0431) BP: (104-130)/(54-80) 130/62 (01/10 0431) SpO2:  [92 %-98 %] 96 % (01/10 0431) well approximated incision Heels are non tender and elevated off the bed using rolled towels Intake/Output from previous day: 01/09 0701 - 01/10 0700 In: 480 [P.O.:480] Out: 1320 [Urine:1000; Drains:320] Intake/Output this shift: No intake/output data recorded.   Recent Labs  11/10/16 0331 11/11/16 0318  HGB 10.1* 9.7*    Recent Labs  11/10/16 0331 11/11/16 0318  WBC 7.0 5.6  RBC 3.28* 3.13*  HCT 30.5* 28.6*  PLT 151 153    Recent Labs  11/10/16 0331 11/11/16 0318  NA 139 140  K 3.3* 3.8  CL 107 106  CO2 27 28  BUN 14 14  CREATININE 0.76 0.61  GLUCOSE 118* 123*  CALCIUM 7.9* 8.3*   No results for input(s): LABPT, INR in the last 72 hours.  EXAM General - Patient is Alert, Appropriate and Oriented Extremity - Neurologically intact Neurovascular intact Sensation intact distally Intact pulses distally Dorsiflexion/Plantar flexion intact No cellulitis present Compartment soft Dressing - scant drainage Motor Function - intact, moving foot and toes well on exam.    Past Medical History:  Diagnosis Date  . Arthritis   . Cancer ([place])    right arm - skin melanoma  . Depression    with loss of son  . Diabetes mellitus without complication (HCC)    prediabetic  . Hyperlipidemia 2014  .  Hypertension   . Pain    right knee  . PVD (peripheral vascular disease) (HCC)     Assessment/Plan: 2 Days Post-Op Procedure(s) (LRB): COMPUTER ASSISTED TOTAL KNEE ARTHROPLASTY (Right) Active Problems:   S/P total knee arthroplasty  Estimated body mass index is 36.28 kg/m as calculated from the following:   Height as of this encounter: 5\' 5"  (1.651 m).   Weight as of this encounter: 98.9 kg (218 lb 0.6 oz). Up with therapy Discharge home with home health after patient has bowel movement and does steps with physical therapy  Labs: Were reviewed DVT Prophylaxis - Lovenox, Foot Pumps and TED hose Weight-Bearing as tolerated to right leg Hemovac discontinued on today's visit Patient needs to have a bowel movement prior to being discharged Please apply TED stockings to both leg prior to being discharged Please change dressing prior to being discharged and give the patient 2 extra honeycomb dressings to take home  [place]. [place] 11/11/2016, 7:14 AM

## 2016-11-11 NOTE — Progress Notes (Signed)
Physical Therapy Treatment Patient Details Name: Kendra Downs MRN: [id number] DOB: [birth year] Today's Date: 11/11/2016    History of Present Illness Pt underwent R TKR without reported post-op complications, currently POD #2, progressing well.    PT Comments    Patient with excellent progress towards all therapy goals.  Initiated/completed stair training without difficulty, performing both sequential stairs and platform step with no greater than cga.  Good technique, good R LE stability with all WBing activities.  Ok for discharge home from mobility standpoint as patient medically ready. Patient scheduled for discharge this date; comfortable with functional status and level of assist required upon discharge.  No additional questions/concerns regarding discharge.   Follow Up Recommendations  Home health PT     Equipment Recommendations       Recommendations for Other Services       Precautions / Restrictions Precautions Precautions: Fall;Knee Restrictions Weight Bearing Restrictions: Yes RLE Weight Bearing: Weight bearing as tolerated    Mobility  Bed Mobility Overal bed mobility: Modified Independent Bed Mobility: Supine to Sit     Supine to sit: Modified independent (Device/Increase time)        Transfers Overall transfer level: Needs assistance Equipment used: Rolling walker (2 wheeled) Transfers: Sit to/from Stand Sit to Stand: Supervision         General transfer comment: good carry-over of education regarding UE/LE foot placement, progressive WBing R LE with movement transitions  Ambulation/Gait Ambulation/Gait assistance: Supervision Ambulation Distance (Feet): 220 Feet Assistive device: Rolling walker (2 wheeled)   Gait velocity: 10' walk time, 10 seconds   General Gait Details: continues to maintain R LE in guarded position, limited knee flexion in swing phase; fair cadence/gait speed with good R knee control in WBing   Stairs Stairs: Yes   Stair Management: Two rails Number of Stairs: 4 General stair comments: min cuing for technique; good R knee control in modified SLS.  Second trial of stairs with RW (up/down single platform), cga.  Good demonstration of safe technique, good control/balance.  Wheelchair Mobility    Modified Rankin (Stroke Patients Only)       Balance                                    Cognition Arousal/Alertness: Awake/alert Behavior During Therapy: WFL for tasks assessed/performed Overall Cognitive Status: Within Functional Limits for tasks assessed                      Exercises Total Joint Exercises Goniometric ROM: 5-90 degrees, act assist ROM Other Exercises Other Exercises: Verbally reviewed car transfer technqiue; patient/sister voiced understanding.    General Comments        Pertinent Vitals/Pain Pain Assessment: 0-10 Pain Score: 4  Pain Location: Right Knee Pain Descriptors / Indicators: Operative site guarding;Aching;Grimacing;Guarding Pain Intervention(s): Limited activity within patient's tolerance;Monitored during session;Repositioned;RN gave pain meds during session    Home Living                      Prior Function            PT Goals (current goals can now be found in the care plan section) Acute Rehab PT Goals Patient Stated Goal: To return home. PT Goal Formulation: With patient/family Time For Goal Achievement: 11/23/16 Potential to Achieve Goals: Good Progress towards PT goals: Progressing toward goals    Frequency  BID      PT Plan Current plan remains appropriate    Co-evaluation             End of Session Equipment Utilized During Treatment: Gait belt Activity Tolerance: Patient tolerated treatment well Patient left: in chair;with call bell/phone within reach;with chair alarm set     Time: [id number] PT Time Calculation (min) (ACUTE ONLY): 31 min  Charges:  $Gait Training: 8-22 mins $Therapeutic  Activity: 8-22 mins                    G Codes:      Kanden Carey H. Owens Shark, PT, DPT, NCS 11/11/16, 9:46 AM [phone]

## 2016-11-11 NOTE — Progress Notes (Signed)
While rounding, [place] made initial visit to room 135. Pt was sitting in chair by the bed. Sister was bedside. Pt was in good spirits and feels she is making good progress. Pt hopes for a discharge today. CH provided the ministry of conversation. CH is available for follow up as needed.    11/11/16 1300  Clinical Encounter Type  Visited With Patient and family together  Visit Type Initial  Referral From Chaplain

## 2016-11-11 NOTE — Care Management Note (Signed)
Case Management Note  Patient Details  Name: KYMBERLI TIPPIT MRN: [id number] Date of Birth: July 07, 1946  Subjective/Objective:   Discharging today                Action/Plan: [facility] notified of discharge. Cost of Lovenox is $36.00. Patient agreeable  Expected Discharge Date:    11/11/2016              Expected Discharge Plan:  [place]  In-House Referral:     Discharge planning Services  CM Consult  Post Acute Care Choice:  Home Health Choice offered to:  Patient  DME Arranged:    DME Agency:     HH Arranged:  PT [place]:  [facility] (now [facility])  Status of Service:  Completed, signed off  If discussed at [employer] of Stay Meetings, dates discussed:    Additional Comments:  Jolly Mango, RN 11/11/2016, 10:06 AM

## 2016-11-11 NOTE — Progress Notes (Signed)
Pt being discharged home today. PIV removed, discharge instructions reviewed with pt and family, all questions were answered. Prescriptions given to pt to have filled. Her follow up appointment has been scheduled. She will have HHPT at discharge. Her dressing was changed prior to discharge. She is leaving with all her belongings, she will be transported home via family member.

## 2016-11-11 NOTE — Progress Notes (Signed)
[place] rounding the unit visit the Pt. Pt is a Curator. [place] talked with the Pt who had her sister bedside. Pt was to be discharged today. [place] provided support and presence.    11/11/16 1600  Clinical Encounter Type  Visited With Patient;Family  Visit Type Follow-up  Referral From Chaplain  Spiritual Encounters  Spiritual Needs Prayer;Emotional

## 2016-11-12 ENCOUNTER — Other Ambulatory Visit: Payer: Self-pay

## 2016-11-12 NOTE — Patient Outreach (Signed)
[place] [facility]) Care Management  11/12/2016  NISREEN HOUSEKNECHT [birth year] [id number]   Spoke to patient by phone today to follow up regarding her surgery.  She answered immediately and stated "it really wasn't that bad".  Complains of no pain but does say she feels "just a little bit nauseated when I walk around".  Reports she is checking blood sugars bid as I requested and blood sugars have been no greater than the mid-140's.  Her sister is staying with her while she recuperates and has gone out now to get Shakila her medications.     Encouraged to call if she has any concerns.   Gentry Fitz, RN, BA, [place], [place] Direct Dial:  [phone]  Fax:  [phone] E-mail: [email or web address] [address] [place], [place]  [address]

## 2016-11-14 DIAGNOSIS — I10 Essential (primary) hypertension: Secondary | ICD-10-CM | POA: Diagnosis not present

## 2016-11-14 DIAGNOSIS — F329 Major depressive disorder, single episode, unspecified: Secondary | ICD-10-CM | POA: Diagnosis not present

## 2016-11-14 DIAGNOSIS — Z471 Aftercare following joint replacement surgery: Secondary | ICD-10-CM | POA: Diagnosis not present

## 2016-11-14 DIAGNOSIS — Z96651 Presence of right artificial knee joint: Secondary | ICD-10-CM | POA: Diagnosis not present

## 2016-11-14 DIAGNOSIS — E785 Hyperlipidemia, unspecified: Secondary | ICD-10-CM | POA: Diagnosis not present

## 2016-11-14 DIAGNOSIS — E1151 Type 2 diabetes mellitus with diabetic peripheral angiopathy without gangrene: Secondary | ICD-10-CM | POA: Diagnosis not present

## 2016-11-14 DIAGNOSIS — Z79891 Long term (current) use of opiate analgesic: Secondary | ICD-10-CM | POA: Diagnosis not present

## 2016-11-14 DIAGNOSIS — Z7901 Long term (current) use of anticoagulants: Secondary | ICD-10-CM | POA: Diagnosis not present

## 2016-11-16 DIAGNOSIS — Z7901 Long term (current) use of anticoagulants: Secondary | ICD-10-CM | POA: Diagnosis not present

## 2016-11-16 DIAGNOSIS — E1151 Type 2 diabetes mellitus with diabetic peripheral angiopathy without gangrene: Secondary | ICD-10-CM | POA: Diagnosis not present

## 2016-11-16 DIAGNOSIS — E785 Hyperlipidemia, unspecified: Secondary | ICD-10-CM | POA: Diagnosis not present

## 2016-11-16 DIAGNOSIS — F329 Major depressive disorder, single episode, unspecified: Secondary | ICD-10-CM | POA: Diagnosis not present

## 2016-11-16 DIAGNOSIS — Z79891 Long term (current) use of opiate analgesic: Secondary | ICD-10-CM | POA: Diagnosis not present

## 2016-11-16 DIAGNOSIS — I10 Essential (primary) hypertension: Secondary | ICD-10-CM | POA: Diagnosis not present

## 2016-11-16 DIAGNOSIS — Z96651 Presence of right artificial knee joint: Secondary | ICD-10-CM | POA: Diagnosis not present

## 2016-11-16 DIAGNOSIS — Z471 Aftercare following joint replacement surgery: Secondary | ICD-10-CM | POA: Diagnosis not present

## 2016-11-17 DIAGNOSIS — Z7901 Long term (current) use of anticoagulants: Secondary | ICD-10-CM | POA: Diagnosis not present

## 2016-11-17 DIAGNOSIS — E1151 Type 2 diabetes mellitus with diabetic peripheral angiopathy without gangrene: Secondary | ICD-10-CM | POA: Diagnosis not present

## 2016-11-17 DIAGNOSIS — Z79891 Long term (current) use of opiate analgesic: Secondary | ICD-10-CM | POA: Diagnosis not present

## 2016-11-17 DIAGNOSIS — Z96651 Presence of right artificial knee joint: Secondary | ICD-10-CM | POA: Diagnosis not present

## 2016-11-17 DIAGNOSIS — I10 Essential (primary) hypertension: Secondary | ICD-10-CM | POA: Diagnosis not present

## 2016-11-17 DIAGNOSIS — Z471 Aftercare following joint replacement surgery: Secondary | ICD-10-CM | POA: Diagnosis not present

## 2016-11-17 DIAGNOSIS — E785 Hyperlipidemia, unspecified: Secondary | ICD-10-CM | POA: Diagnosis not present

## 2016-11-17 DIAGNOSIS — F329 Major depressive disorder, single episode, unspecified: Secondary | ICD-10-CM | POA: Diagnosis not present

## 2016-11-20 DIAGNOSIS — E785 Hyperlipidemia, unspecified: Secondary | ICD-10-CM | POA: Diagnosis not present

## 2016-11-20 DIAGNOSIS — Z471 Aftercare following joint replacement surgery: Secondary | ICD-10-CM | POA: Diagnosis not present

## 2016-11-20 DIAGNOSIS — F329 Major depressive disorder, single episode, unspecified: Secondary | ICD-10-CM | POA: Diagnosis not present

## 2016-11-20 DIAGNOSIS — Z96651 Presence of right artificial knee joint: Secondary | ICD-10-CM | POA: Diagnosis not present

## 2016-11-20 DIAGNOSIS — Z7901 Long term (current) use of anticoagulants: Secondary | ICD-10-CM | POA: Diagnosis not present

## 2016-11-20 DIAGNOSIS — I10 Essential (primary) hypertension: Secondary | ICD-10-CM | POA: Diagnosis not present

## 2016-11-20 DIAGNOSIS — E1151 Type 2 diabetes mellitus with diabetic peripheral angiopathy without gangrene: Secondary | ICD-10-CM | POA: Diagnosis not present

## 2016-11-20 DIAGNOSIS — Z79891 Long term (current) use of opiate analgesic: Secondary | ICD-10-CM | POA: Diagnosis not present

## 2016-11-23 DIAGNOSIS — F329 Major depressive disorder, single episode, unspecified: Secondary | ICD-10-CM | POA: Diagnosis not present

## 2016-11-23 DIAGNOSIS — Z96651 Presence of right artificial knee joint: Secondary | ICD-10-CM | POA: Diagnosis not present

## 2016-11-23 DIAGNOSIS — I10 Essential (primary) hypertension: Secondary | ICD-10-CM | POA: Diagnosis not present

## 2016-11-23 DIAGNOSIS — Z79891 Long term (current) use of opiate analgesic: Secondary | ICD-10-CM | POA: Diagnosis not present

## 2016-11-23 DIAGNOSIS — E785 Hyperlipidemia, unspecified: Secondary | ICD-10-CM | POA: Diagnosis not present

## 2016-11-23 DIAGNOSIS — Z471 Aftercare following joint replacement surgery: Secondary | ICD-10-CM | POA: Diagnosis not present

## 2016-11-23 DIAGNOSIS — Z7901 Long term (current) use of anticoagulants: Secondary | ICD-10-CM | POA: Diagnosis not present

## 2016-11-23 DIAGNOSIS — E1151 Type 2 diabetes mellitus with diabetic peripheral angiopathy without gangrene: Secondary | ICD-10-CM | POA: Diagnosis not present

## 2016-11-24 DIAGNOSIS — Z96651 Presence of right artificial knee joint: Secondary | ICD-10-CM | POA: Diagnosis not present

## 2016-11-24 DIAGNOSIS — M25561 Pain in right knee: Secondary | ICD-10-CM | POA: Diagnosis not present

## 2016-11-26 DIAGNOSIS — Z96651 Presence of right artificial knee joint: Secondary | ICD-10-CM | POA: Diagnosis not present

## 2016-11-26 DIAGNOSIS — M25561 Pain in right knee: Secondary | ICD-10-CM | POA: Diagnosis not present

## 2016-11-30 DIAGNOSIS — M25561 Pain in right knee: Secondary | ICD-10-CM | POA: Diagnosis not present

## 2016-11-30 DIAGNOSIS — Z96651 Presence of right artificial knee joint: Secondary | ICD-10-CM | POA: Diagnosis not present

## 2016-11-30 DIAGNOSIS — R29898 Other symptoms and signs involving the musculoskeletal system: Secondary | ICD-10-CM | POA: Diagnosis not present

## 2016-11-30 DIAGNOSIS — M25661 Stiffness of right knee, not elsewhere classified: Secondary | ICD-10-CM | POA: Diagnosis not present

## 2016-12-02 DIAGNOSIS — M1712 Unilateral primary osteoarthritis, left knee: Secondary | ICD-10-CM | POA: Diagnosis not present

## 2016-12-02 DIAGNOSIS — Z96651 Presence of right artificial knee joint: Secondary | ICD-10-CM | POA: Diagnosis not present

## 2016-12-02 DIAGNOSIS — M25661 Stiffness of right knee, not elsewhere classified: Secondary | ICD-10-CM | POA: Diagnosis not present

## 2016-12-02 DIAGNOSIS — M1711 Unilateral primary osteoarthritis, right knee: Secondary | ICD-10-CM | POA: Diagnosis not present

## 2016-12-02 DIAGNOSIS — M25561 Pain in right knee: Secondary | ICD-10-CM | POA: Diagnosis not present

## 2016-12-02 DIAGNOSIS — R29898 Other symptoms and signs involving the musculoskeletal system: Secondary | ICD-10-CM | POA: Diagnosis not present

## 2016-12-04 DIAGNOSIS — Z96651 Presence of right artificial knee joint: Secondary | ICD-10-CM | POA: Diagnosis not present

## 2016-12-04 DIAGNOSIS — M1711 Unilateral primary osteoarthritis, right knee: Secondary | ICD-10-CM | POA: Diagnosis not present

## 2016-12-07 DIAGNOSIS — Z96651 Presence of right artificial knee joint: Secondary | ICD-10-CM | POA: Diagnosis not present

## 2016-12-07 DIAGNOSIS — M25561 Pain in right knee: Secondary | ICD-10-CM | POA: Diagnosis not present

## 2016-12-09 DIAGNOSIS — M25661 Stiffness of right knee, not elsewhere classified: Secondary | ICD-10-CM | POA: Diagnosis not present

## 2016-12-09 DIAGNOSIS — M25561 Pain in right knee: Secondary | ICD-10-CM | POA: Diagnosis not present

## 2016-12-09 DIAGNOSIS — R29898 Other symptoms and signs involving the musculoskeletal system: Secondary | ICD-10-CM | POA: Diagnosis not present

## 2016-12-09 DIAGNOSIS — Z96651 Presence of right artificial knee joint: Secondary | ICD-10-CM | POA: Diagnosis not present

## 2016-12-11 DIAGNOSIS — Z96651 Presence of right artificial knee joint: Secondary | ICD-10-CM | POA: Diagnosis not present

## 2016-12-11 DIAGNOSIS — M25561 Pain in right knee: Secondary | ICD-10-CM | POA: Diagnosis not present

## 2016-12-14 DIAGNOSIS — R29898 Other symptoms and signs involving the musculoskeletal system: Secondary | ICD-10-CM | POA: Diagnosis not present

## 2016-12-14 DIAGNOSIS — Z96651 Presence of right artificial knee joint: Secondary | ICD-10-CM | POA: Diagnosis not present

## 2016-12-14 DIAGNOSIS — M25561 Pain in right knee: Secondary | ICD-10-CM | POA: Diagnosis not present

## 2016-12-14 DIAGNOSIS — M25661 Stiffness of right knee, not elsewhere classified: Secondary | ICD-10-CM | POA: Diagnosis not present

## 2016-12-16 DIAGNOSIS — M25661 Stiffness of right knee, not elsewhere classified: Secondary | ICD-10-CM | POA: Diagnosis not present

## 2016-12-16 DIAGNOSIS — Z96651 Presence of right artificial knee joint: Secondary | ICD-10-CM | POA: Diagnosis not present

## 2016-12-16 DIAGNOSIS — M25561 Pain in right knee: Secondary | ICD-10-CM | POA: Diagnosis not present

## 2016-12-18 DIAGNOSIS — M25561 Pain in right knee: Secondary | ICD-10-CM | POA: Diagnosis not present

## 2016-12-18 DIAGNOSIS — R29898 Other symptoms and signs involving the musculoskeletal system: Secondary | ICD-10-CM | POA: Diagnosis not present

## 2016-12-18 DIAGNOSIS — Z96651 Presence of right artificial knee joint: Secondary | ICD-10-CM | POA: Diagnosis not present

## 2016-12-18 DIAGNOSIS — M25661 Stiffness of right knee, not elsewhere classified: Secondary | ICD-10-CM | POA: Diagnosis not present

## 2016-12-21 DIAGNOSIS — M25661 Stiffness of right knee, not elsewhere classified: Secondary | ICD-10-CM | POA: Diagnosis not present

## 2016-12-21 DIAGNOSIS — Z96651 Presence of right artificial knee joint: Secondary | ICD-10-CM | POA: Diagnosis not present

## 2016-12-21 DIAGNOSIS — M25561 Pain in right knee: Secondary | ICD-10-CM | POA: Diagnosis not present

## 2016-12-21 DIAGNOSIS — R29898 Other symptoms and signs involving the musculoskeletal system: Secondary | ICD-10-CM | POA: Diagnosis not present

## 2016-12-22 DIAGNOSIS — M25561 Pain in right knee: Secondary | ICD-10-CM | POA: Diagnosis not present

## 2016-12-22 DIAGNOSIS — Z96651 Presence of right artificial knee joint: Secondary | ICD-10-CM | POA: Diagnosis not present

## 2016-12-24 DIAGNOSIS — Z96651 Presence of right artificial knee joint: Secondary | ICD-10-CM | POA: Diagnosis not present

## 2016-12-24 DIAGNOSIS — M25561 Pain in right knee: Secondary | ICD-10-CM | POA: Diagnosis not present

## 2016-12-25 ENCOUNTER — Other Ambulatory Visit: Payer: Self-pay | Admitting: Family Medicine

## 2016-12-25 DIAGNOSIS — Z1231 Encounter for screening mammogram for malignant neoplasm of breast: Secondary | ICD-10-CM

## 2016-12-28 ENCOUNTER — Other Ambulatory Visit: Payer: Self-pay | Admitting: Family Medicine

## 2016-12-28 ENCOUNTER — Ambulatory Visit
Admission: RE | Admit: 2016-12-28 | Discharge: 2016-12-28 | Disposition: A | Discharge status: Home / Self Care | Payer: 59 | Source: Ambulatory Visit | Attending: Family Medicine | Admitting: Family Medicine

## 2016-12-28 DIAGNOSIS — Z1231 Encounter for screening mammogram for malignant neoplasm of breast: Secondary | ICD-10-CM | POA: Diagnosis not present

## 2016-12-28 DIAGNOSIS — Z96651 Presence of right artificial knee joint: Secondary | ICD-10-CM | POA: Diagnosis not present

## 2016-12-28 DIAGNOSIS — M25561 Pain in right knee: Secondary | ICD-10-CM | POA: Diagnosis not present

## 2016-12-31 DIAGNOSIS — M25561 Pain in right knee: Secondary | ICD-10-CM | POA: Diagnosis not present

## 2016-12-31 DIAGNOSIS — M25661 Stiffness of right knee, not elsewhere classified: Secondary | ICD-10-CM | POA: Diagnosis not present

## 2016-12-31 DIAGNOSIS — Z96651 Presence of right artificial knee joint: Secondary | ICD-10-CM | POA: Diagnosis not present

## 2016-12-31 DIAGNOSIS — R29898 Other symptoms and signs involving the musculoskeletal system: Secondary | ICD-10-CM | POA: Diagnosis not present

## 2017-01-13 DIAGNOSIS — Z01810 Encounter for preprocedural cardiovascular examination: Secondary | ICD-10-CM | POA: Diagnosis not present

## 2017-01-13 DIAGNOSIS — R0602 Shortness of breath: Secondary | ICD-10-CM | POA: Diagnosis not present

## 2017-01-13 DIAGNOSIS — R9431 Abnormal electrocardiogram [ECG] [EKG]: Secondary | ICD-10-CM | POA: Diagnosis not present

## 2017-01-18 DIAGNOSIS — Z471 Aftercare following joint replacement surgery: Secondary | ICD-10-CM | POA: Diagnosis not present

## 2017-01-25 ENCOUNTER — Other Ambulatory Visit: Payer: Self-pay

## 2017-01-25 DIAGNOSIS — E119 Type 2 diabetes mellitus without complications: Secondary | ICD-10-CM

## 2017-01-25 NOTE — Patient Outreach (Signed)
[facility] [facility]) Care Management  [place]  01/26/2017   DELORESE SELLIN 10-17-1946 [id number]  Subjective: Met with patient for her Link to Wellness diabetes visit. She continues to take her medications as ordered (takes no diabetes medications).  He blood sugar averages for 7-14 and 30 days have improved about 25 points since I last saw her (7 day average 177m/dl, 14 day average 128 mg/dl and 30 day average 1231mdl)- she is only checking fasting blood sugars. She denies hyper or hypoglycemia.  She is eating 3 meals per day.  She would like to lose some weight (would like to be 185lbs)- she is eating smaller meals and exercising 3x/week at the gym to accomplish this.   She had a right knee replacement in January 2018- she has improved mobility and no pain. She is thrilled with the improvement. She is back to work full time.   Objective:  Vitals:   01/25/17 1440  BP: 138/70  Pulse: 77  SpO2: 97%  Weight: 213 lb 4.8 oz (96.8 kg)  Height: 1.651 m (_0 )     Encounter Medications:  Outpatient Encounter Prescriptions as of 01/25/2017  Medication Sig  . hydrochlorothiazide (MICROZIDE) 12.5 MG capsule Take 12.5 mg by mouth daily.  . quinapril-hydrochlorothiazide (ACCURETIC) 20-12.5 MG tablet Take 1 tablet by mouth daily.   . Vitamin D, Ergocalciferol, (DRISDOL) 50000 units CAPS capsule Take 50,000 Units by mouth every 7 (seven) days. Takes either Sat and Sun  . COENZYME Q10-OMEGA 3 FATTY ACD PO Take 2 capsules by mouth daily.  . Marland Kitchennoxaparin (LOVENOX) 40 MG/0.4ML injection Inject 0.4 mLs (40 mg total) into the skin daily. (Patient not taking: Reported on 01/25/2017)  . oxyCODONE (OXY IR/ROXICODONE) 5 MG immediate release tablet Take 1-2 tablets (5-10 mg total) by mouth every 4 (four) hours as needed for severe pain or breakthrough pain. (Patient not taking: Reported on 01/25/2017)  . traMADol (ULTRAM) 50 MG tablet Take 1-2 tablets (50-100 mg total) by mouth every 4  (four) hours as needed for moderate pain. (Patient not taking: Reported on 01/25/2017)   No facility-administered encounter medications on file as of 01/25/2017.     Functional Status:  In your present state of health, do you have any difficulty performing the following activities: 01/25/2017 11/09/2016  Hearing? Y BartholomewN -  Difficulty concentrating or making decisions? Y -  Walking or climbing stairs? Y -  Dressing or bathing? N -  Doing errands, shopping? N N  Some recent data might be hidden    Fall/Depression Screening: PHQ 2/9 Scores 01/25/2017 11/05/2016 05/27/2016 11/27/2015 08/21/2015 08/12/2015  PHQ - 2 Score 0 0 1 0 4 4  PHQ- 9 Score - - - - 6 6    Assessment: Diabetes: Most recent A1C was 6.1% in December 2017. Weight is decreased from 218.2lbs at last visit, to 213.3lbs this visit.   Plan:  [facility] Problem One     Most Recent Value  Role Documenting the Problem One  Care Management Coordinator  Care Plan for Problem One  Active  THN Long Term Goal (31-90 days)  Patient will maintain an A1C of less than 6.5% when it is checked next by MD or [facility] Goal Start Date  01/25/17  Interventions for Problem One Long Term Goal  Discussed the impact of exercise on strength, cardiovascular health, mental health and benefit on blood sugar control.      I have recommended she  schedule a follow up appointment with Dr. Kary Kos, continue to check blood sugars most days of the week,  And schedule her annual dilated eye exam.  I have a follow up appointment scheduled with her in June.   Gentry Fitz, RN, BA, [place], [place] Direct Dial:  [phone]  Fax:  [phone] E-mail: [email or web address] [address], [place],   [address]

## 2017-02-10 DIAGNOSIS — M1712 Unilateral primary osteoarthritis, left knee: Secondary | ICD-10-CM | POA: Diagnosis not present

## 2017-06-15 DIAGNOSIS — Z96651 Presence of right artificial knee joint: Secondary | ICD-10-CM | POA: Diagnosis not present

## 2017-06-15 DIAGNOSIS — M1712 Unilateral primary osteoarthritis, left knee: Secondary | ICD-10-CM | POA: Diagnosis not present

## 2017-06-25 DIAGNOSIS — Z0000 Encounter for general adult medical examination without abnormal findings: Secondary | ICD-10-CM | POA: Diagnosis not present

## 2017-07-01 DIAGNOSIS — E119 Type 2 diabetes mellitus without complications: Secondary | ICD-10-CM | POA: Diagnosis not present

## 2017-07-01 DIAGNOSIS — Z1159 Encounter for screening for other viral diseases: Secondary | ICD-10-CM | POA: Diagnosis not present

## 2017-07-01 DIAGNOSIS — Z23 Encounter for immunization: Secondary | ICD-10-CM | POA: Diagnosis not present

## 2017-07-01 DIAGNOSIS — Z0000 Encounter for general adult medical examination without abnormal findings: Secondary | ICD-10-CM | POA: Diagnosis not present

## 2017-07-19 ENCOUNTER — Other Ambulatory Visit: Payer: Self-pay

## 2017-07-19 VITALS — BP 140/62 | Ht 65.0 in | Wt 219.0 lb

## 2017-07-19 DIAGNOSIS — E119 Type 2 diabetes mellitus without complications: Secondary | ICD-10-CM

## 2017-07-19 NOTE — Patient Outreach (Signed)
[place] [facility]) Care Management  [place]  07/19/2017   ADEL NEYER November 24, 1945 [id number]  Subjective: Met with Siren in the Link to Wellness diabetes program.  She continues to check her blood sugar daily and denies hypo or hyperglycemia.  She is exercising in the [facility] for 15-30 minutes 4x/week.  She is becoming more bothered with her left knee giving her pain while she exercises.  She has a scheduled left knee replacement for September 06, 2017.  She is using Aleve to manage pain.  She has gained some weight over the past few months since she had her right knee rep-laced but she has already begun to make some diet changes to bring her weight down- she is aiming for 200 lbs. No diabetes medications.   Objective:  Vitals:   07/19/17 1433  BP: 140/62  Weight: 219 lb (99.3 kg)  Height: 1.651 m ('5\' 5"'$ )     Encounter Medications:  Outpatient Encounter Prescriptions as of 07/19/2017  Medication Sig  . etodolac (LODINE) 500 MG tablet Take 500 mg by mouth 2 (two) times daily.  . hydrochlorothiazide (MICROZIDE) 12.5 MG capsule Take 12.5 mg by mouth daily.  . naproxen sodium (ANAPROX) 220 MG tablet Take 220 mg by mouth 2 (two) times daily as needed.  . quinapril-hydrochlorothiazide (ACCURETIC) 20-12.5 MG tablet Take 1 tablet by mouth daily.   . Vitamin D, Ergocalciferol, (DRISDOL) 50000 units CAPS capsule Take 50,000 Units by mouth every 7 (seven) days. Takes either Sat and Sun  . COENZYME Q10-OMEGA 3 FATTY ACD PO Take 2 capsules by mouth daily.  Marland Kitchen enoxaparin (LOVENOX) 40 MG/0.4ML injection Inject 0.4 mLs (40 mg total) into the skin daily. (Patient not taking: Reported on 01/25/2017)  . oxyCODONE (OXY IR/ROXICODONE) 5 MG immediate release tablet Take 1-2 tablets (5-10 mg total) by mouth every 4 (four) hours as needed for severe pain or breakthrough pain. (Patient not taking: Reported on 01/25/2017)  . traMADol (ULTRAM) 50 MG tablet Take 1-2 tablets (50-100  mg total) by mouth every 4 (four) hours as needed for moderate pain. (Patient not taking: Reported on 01/25/2017)   No facility-administered encounter medications on file as of 07/19/2017.     Functional Status:  In your present state of health, do you have any difficulty performing the following activities: 07/19/2017 01/25/2017  Hearing? N Y  Vision? - N  Difficulty concentrating or making decisions? - Y  Comment - occassional  Walking or climbing stairs? - Y  Comment - improved since knee replacement January 2018  Dressing or bathing? - N  Doing errands, shopping? - N  Some recent data might be hidden    Fall/Depression Screening: Fall Risk  07/19/2017 01/25/2017 11/05/2016  Falls in the past year? No No No   PHQ 2/9 Scores 07/19/2017 01/25/2017 11/05/2016 05/27/2016 11/27/2015 08/21/2015 08/12/2015  PHQ - 2 Score 0 0 0 1 0 4 4  PHQ- 9 Score - - - - - 6 6    Assessment: [place] is pro-actively working on managing her blood sugars. She checks her blood sugars regularly.  She checked her blood sugars multiple times one day to evaluate pre and post meal blood sugars and the blood sugars were always about 90-'125mg'$ /dl. She joined the gym after her right knee replacement and continues to go 4x/week. She denies SOB or chest pain- we reviewed the signs and symptoms of both.  She drinks a protein shake for breakfast, eats 1/2 sandwich and 1/2 avocado for break  and a salad with protein for supper. She eats a large meal at lunchtime.   Plan: she has had her flu vaccine and has the prescription for the pneumonia vaccine- she will get that administered in a few weeks.  THN CM Care Plan Problem One     Most Recent Value  Care Plan Problem One  Potential for elevated A1C  Role Documenting the Problem One  Care Management Coordinator  Care Plan for Problem One  Active  THN Long Term Goal   Patient will maintain an A1C of less than 6.5% when it is checked next by MD or [facility]  THN Long Term Goal Start Date   07/19/17  [facility] Long Term Goal Met Date  -- [met on 07/19/17- reinstate goal]  Interventions for Problem One Long Term Goal  1. discussed the importance of portion control  2. suggested upper body weight litfing in preparation for surgery in November 3. Continue exercising 4x/week for 15-30 minutes      Follow up after her knee replacement in late November.  Gentry Fitz, RN, BA, MHA, CDE Diabetes Coordinator Inpatient Diabetes Program  [phone] (Team Pager) [phone] ([place]) 07/19/2017 3:59 PM

## 2017-07-20 ENCOUNTER — Ambulatory Visit: Payer: Self-pay

## 2017-08-23 ENCOUNTER — Encounter
Admission: RE | Admit: 2017-08-23 | Discharge: 2017-08-23 | Disposition: A | Discharge status: Home / Self Care | Payer: 59 | Source: Ambulatory Visit | Attending: Orthopedic Surgery | Admitting: Orthopedic Surgery

## 2017-08-23 DIAGNOSIS — Z01812 Encounter for preprocedural laboratory examination: Secondary | ICD-10-CM | POA: Diagnosis not present

## 2017-08-23 DIAGNOSIS — Z01810 Encounter for preprocedural cardiovascular examination: Secondary | ICD-10-CM | POA: Insufficient documentation

## 2017-08-23 HISTORY — DX: Prediabetes: R73.03

## 2017-08-23 LAB — APTT: APTT: 36 s (ref 24–36)

## 2017-08-23 LAB — CBC
HCT: 38.5 % (ref 35.0–47.0)
Hemoglobin: 13.1 g/dL (ref 12.0–16.0)
MCH: 30.8 pg (ref 26.0–34.0)
MCHC: 34 g/dL (ref 32.0–36.0)
MCV: 90.5 fL (ref 80.0–100.0)
PLATELETS: 196 10*3/uL (ref 150–440)
RBC: 4.25 MIL/uL (ref 3.80–5.20)
RDW: 15.3 % — ABNORMAL HIGH (ref 11.5–14.5)
WBC: 5.3 10*3/uL (ref 3.6–11.0)

## 2017-08-23 LAB — COMPREHENSIVE METABOLIC PANEL
ALT: 8 U/L — AB (ref 14–54)
AST: 19 U/L (ref 15–41)
Albumin: 3.9 g/dL (ref 3.5–5.0)
Alkaline Phosphatase: 53 U/L (ref 38–126)
Anion gap: 8 (ref 5–15)
BUN: 19 mg/dL (ref 6–20)
CHLORIDE: 106 mmol/L (ref 101–111)
CO2: 27 mmol/L (ref 22–32)
CREATININE: 0.66 mg/dL (ref 0.44–1.00)
Calcium: 9.1 mg/dL (ref 8.9–10.3)
GFR calc Af Amer: >60 mL/min (ref >60)
GFR calc non Af Amer: >60 mL/min (ref >60)
Glucose, Bld: 90 mg/dL (ref 65–99)
Potassium: 3.9 mmol/L (ref 3.5–5.1)
SODIUM: 141 mmol/L (ref 135–145)
Total Bilirubin: 1.3 mg/dL — ABNORMAL HIGH (ref 0.3–1.2)
Total Protein: 7.3 g/dL (ref 6.5–8.1)

## 2017-08-23 LAB — URINALYSIS, ROUTINE W REFLEX MICROSCOPIC
Glucose, UA: NEGATIVE mg/dL
Hgb urine dipstick: NEGATIVE
Ketones, ur: NEGATIVE mg/dL
LEUKOCYTES UA: NEGATIVE
NITRITE: NEGATIVE
PH: 5 (ref 5.0–8.0)
Protein, ur: NEGATIVE mg/dL
SPECIFIC GRAVITY, URINE: 1.021 (ref 1.005–1.030)

## 2017-08-23 LAB — PROTIME-INR
INR: 1.01
Prothrombin Time: 13.2 seconds (ref 11.4–15.2)

## 2017-08-23 LAB — SEDIMENTATION RATE: Sed Rate: 22 mm/hr (ref 0–30)

## 2017-08-23 LAB — SURGICAL PCR SCREEN
MRSA, PCR: NEGATIVE
STAPHYLOCOCCUS AUREUS: NEGATIVE

## 2017-08-23 LAB — C-REACTIVE PROTEIN: CRP: 1.2 mg/dL — ABNORMAL HIGH (ref <1.0)

## 2017-08-23 NOTE — Patient Instructions (Signed)
Your procedure is scheduled on: Monday 09/06/17 Report to [facility]. 2ND FLOOR MEDICAL MALL ENTRANCE. To find out your arrival time please call [phone] between 1PM - 3PM on Friday 09/03/17.  Remember: Instructions that are not followed completely may result in serious medical risk, up to and including death, or upon the discretion of your surgeon and anesthesiologist your surgery may need to be rescheduled.    __X__ 1. Do not eat anything after midnight the night before your    procedure.  No gum chewing or hard candies.  You may drink clear   liquids up to 2 hours before you are scheduled to arrive at the   hospital for your procedure. Do not drink clear liquids within 2   hours of scheduled arrival to the hospital as this may lead to your   procedure being delayed or rescheduled.       Clear liquids include:   Water or Apple juice without pulp   Clear carbohydrate beverage such as Clearfast or Gatorade   Black coffee or Clear Tea (no milk, no creamer, do not add anything   to the coffee or tea)    Diabetics should only drink water   __X__ 2. No Alcohol for 24 hours before or after surgery.   ____ 3. Bring all medications with you on the day of surgery if instructed.    __X__ 4. Notify your doctor if there is any change in your medical condition     (cold, fever, infections).             __X___5. No smoking within 24 hours of your surgery.     Do not wear jewelry, make-up, hairpins, clips or nail polish.  Do not wear lotions, powders, or perfumes.   Do not shave 48 hours prior to surgery. Men may shave face and neck.  Do not bring valuables to the hospital.    [facility] is not responsible for any belongings or valuables.               Contacts, dentures or bridgework may not be worn into surgery.  Leave your suitcase in the car. After surgery it may be brought to your room.  For patients admitted to the hospital, discharge time is determined by your                treatment  team.   Patients discharged the day of surgery will not be allowed to drive home.   Please read over the following fact sheets that you were given:   MRSA Information   __X__ Take these medicines the morning of surgery with A SIP OF WATER:    1. NONE  2.   3.   4.  5.  6.  ____ Fleet Enema (as directed)   __X__ Use CHG Soap/SAGE wipes as directed  ____ Use inhalers on the day of surgery  ____ Stop metformin 2 days prior to surgery    ____ Take 1/2 of usual insulin dose the night before surgery and none on the morning of surgery.   ____ Stop Coumadin/Plavix/aspirin on   __X__ Stop Anti-inflammatories such as Advil, Aleve, Ibuprofen, Motrin, Naproxen, Naprosyn, Goodies,powder, or aspirin products. STOP ETODOLAC ALSO  OK to take Tylenol.   __X__ Stop supplements, Vitamin E, Fish Oil until after surgery.    ____ Bring C-Pap to the hospital.

## 2017-08-24 LAB — TYPE AND SCREEN
ABO/RH(D): O POS
ANTIBODY SCREEN: NEGATIVE

## 2017-08-24 LAB — URINE CULTURE
Culture: NO GROWTH
Special Requests: NORMAL

## 2017-08-24 NOTE — Pre-Procedure Instructions (Signed)
CRP results sent to Dr. Hooten for review. 

## 2017-09-03 NOTE — Pre-Procedure Instructions (Signed)
Faxed request for H&P to Dr. Marry Guan.  Date of surgery 09/06/17.

## 2017-09-05 MED ORDER — CEFAZOLIN SODIUM-DEXTROSE 2-4 GM/100ML-% IV SOLN
2.0000 g | INTRAVENOUS | Status: AC
  Administered 2017-09-06: 2 g via INTRAVENOUS

## 2017-09-05 MED ORDER — TRANEXAMIC ACID 1000 MG/10ML IV SOLN
1000.0000 mg | INTRAVENOUS | Status: AC
  Administered 2017-09-06: 1000 mg via INTRAVENOUS
  Filled 2017-09-05: qty 10

## 2017-09-06 ENCOUNTER — Encounter
Admission: RE | Disposition: A | Discharge status: Home Health | Payer: Self-pay | Source: Ambulatory Visit | Attending: Orthopedic Surgery

## 2017-09-06 ENCOUNTER — Inpatient Hospital Stay
Admission: RE | Admit: 2017-09-06 | Discharge: 2017-09-08 | DRG: 470 | Disposition: A | Discharge status: Home Health | Payer: 59 | Source: Ambulatory Visit | Attending: Orthopedic Surgery | Admitting: Orthopedic Surgery

## 2017-09-06 ENCOUNTER — Encounter: Payer: Self-pay | Admitting: Emergency Medicine

## 2017-09-06 ENCOUNTER — Inpatient Hospital Stay: Payer: 59 | Admitting: Anesthesiology

## 2017-09-06 ENCOUNTER — Inpatient Hospital Stay: Payer: 59

## 2017-09-06 ENCOUNTER — Other Ambulatory Visit: Payer: Self-pay

## 2017-09-06 ENCOUNTER — Ambulatory Visit: Payer: Self-pay | Admitting: Orthopedic Surgery

## 2017-09-06 DIAGNOSIS — F329 Major depressive disorder, single episode, unspecified: Secondary | ICD-10-CM | POA: Diagnosis not present

## 2017-09-06 DIAGNOSIS — Z96652 Presence of left artificial knee joint: Secondary | ICD-10-CM | POA: Diagnosis not present

## 2017-09-06 DIAGNOSIS — Z96651 Presence of right artificial knee joint: Secondary | ICD-10-CM | POA: Diagnosis present

## 2017-09-06 DIAGNOSIS — E1151 Type 2 diabetes mellitus with diabetic peripheral angiopathy without gangrene: Secondary | ICD-10-CM | POA: Diagnosis present

## 2017-09-06 DIAGNOSIS — E119 Type 2 diabetes mellitus without complications: Secondary | ICD-10-CM | POA: Diagnosis not present

## 2017-09-06 DIAGNOSIS — E785 Hyperlipidemia, unspecified: Secondary | ICD-10-CM | POA: Diagnosis present

## 2017-09-06 DIAGNOSIS — Z96659 Presence of unspecified artificial knee joint: Secondary | ICD-10-CM

## 2017-09-06 DIAGNOSIS — M1712 Unilateral primary osteoarthritis, left knee: Principal | ICD-10-CM | POA: Diagnosis present

## 2017-09-06 DIAGNOSIS — Z471 Aftercare following joint replacement surgery: Secondary | ICD-10-CM | POA: Diagnosis not present

## 2017-09-06 DIAGNOSIS — Z87891 Personal history of nicotine dependence: Secondary | ICD-10-CM | POA: Diagnosis not present

## 2017-09-06 DIAGNOSIS — I10 Essential (primary) hypertension: Secondary | ICD-10-CM | POA: Diagnosis present

## 2017-09-06 DIAGNOSIS — M25562 Pain in left knee: Secondary | ICD-10-CM | POA: Diagnosis present

## 2017-09-06 DIAGNOSIS — I739 Peripheral vascular disease, unspecified: Secondary | ICD-10-CM | POA: Diagnosis not present

## 2017-09-06 HISTORY — PX: KNEE ARTHROPLASTY: SHX992

## 2017-09-06 LAB — GLUCOSE, CAPILLARY: GLUCOSE-CAPILLARY: 112 mg/dL — AB (ref 65–99)

## 2017-09-06 SURGERY — ARTHROPLASTY, KNEE, TOTAL, USING IMAGELESS COMPUTER-ASSISTED NAVIGATION
Anesthesia: Spinal | Site: Knee | Laterality: Left | Wound class: Clean

## 2017-09-06 MED ORDER — ONDANSETRON HCL 4 MG PO TABS: 4.0000 mg | ORAL_TABLET | Freq: Four times a day (QID) | ORAL | Status: DC | PRN

## 2017-09-06 MED ORDER — OXYCODONE HCL 5 MG/5ML PO SOLN: 5.0000 mg | Freq: Once | ORAL | Status: DC | PRN

## 2017-09-06 MED ORDER — MIDAZOLAM HCL 2 MG/2ML IJ SOLN
INTRAMUSCULAR | Status: AC
  Filled 2017-09-06: qty 2

## 2017-09-06 MED ORDER — LIDOCAINE HCL (PF) 2 % IJ SOLN
INTRAMUSCULAR | Status: AC
  Filled 2017-09-06: qty 10

## 2017-09-06 MED ORDER — ALUM & MAG HYDROXIDE-SIMETH 200-200-20 MG/5ML PO SUSP: 30.0000 mL | ORAL | Status: DC | PRN

## 2017-09-06 MED ORDER — QUINAPRIL-HYDROCHLOROTHIAZIDE 20-12.5 MG PO TABS: 1.0000 | ORAL_TABLET | Freq: Every day | ORAL | Status: DC

## 2017-09-06 MED ORDER — BUPIVACAINE HCL (PF) 0.5 % IJ SOLN
INTRAMUSCULAR | Status: AC
  Filled 2017-09-06: qty 10

## 2017-09-06 MED ORDER — TETRACAINE HCL 1 % IJ SOLN
INTRAMUSCULAR | Status: DC | PRN
  Administered 2017-09-06: 3 mg via INTRASPINAL

## 2017-09-06 MED ORDER — PROPOFOL 500 MG/50ML IV EMUL
INTRAVENOUS | Status: AC
  Filled 2017-09-06: qty 50

## 2017-09-06 MED ORDER — ACETAMINOPHEN 10 MG/ML IV SOLN
INTRAVENOUS | Status: AC
  Filled 2017-09-06: qty 100

## 2017-09-06 MED ORDER — MENTHOL 3 MG MT LOZG
1.0000 | LOZENGE | OROMUCOSAL | Status: DC | PRN
  Filled 2017-09-06: qty 9

## 2017-09-06 MED ORDER — SODIUM CHLORIDE 0.9 % IJ SOLN
INTRAMUSCULAR | Status: AC
Start: 2017-09-06 — End: ?
  Filled 2017-09-06: qty 50

## 2017-09-06 MED ORDER — BUPIVACAINE HCL (PF) 0.25 % IJ SOLN
INTRAMUSCULAR | Status: DC | PRN
  Administered 2017-09-06: 60 mL

## 2017-09-06 MED ORDER — OXYCODONE HCL 5 MG PO TABS: 5.0000 mg | ORAL_TABLET | Freq: Once | ORAL | Status: DC | PRN

## 2017-09-06 MED ORDER — ENOXAPARIN SODIUM 30 MG/0.3ML ~~LOC~~ SOLN
30.0000 mg | Freq: Two times a day (BID) | SUBCUTANEOUS | Status: DC
Start: 2017-09-07 — End: 2017-09-08
  Administered 2017-09-07 – 2017-09-08 (×3): 30 mg via SUBCUTANEOUS
  Filled 2017-09-06 (×3): qty 0.3

## 2017-09-06 MED ORDER — SODIUM CHLORIDE 0.9 % IV SOLN
INTRAVENOUS | Status: DC
  Administered 2017-09-06 – 2017-09-07 (×3): via INTRAVENOUS

## 2017-09-06 MED ORDER — OXYCODONE HCL 5 MG PO TABS: 5.0000 mg | ORAL_TABLET | ORAL | Status: DC | PRN

## 2017-09-06 MED ORDER — CEFAZOLIN SODIUM-DEXTROSE 2-4 GM/100ML-% IV SOLN
INTRAVENOUS | Status: AC
  Filled 2017-09-06: qty 100

## 2017-09-06 MED ORDER — FERROUS SULFATE 325 (65 FE) MG PO TABS
325.0000 mg | ORAL_TABLET | Freq: Two times a day (BID) | ORAL | Status: DC
  Administered 2017-09-06 – 2017-09-08 (×4): 325 mg via ORAL
  Filled 2017-09-06 (×4): qty 1

## 2017-09-06 MED ORDER — ONDANSETRON HCL 4 MG/2ML IJ SOLN: 4.0000 mg | Freq: Four times a day (QID) | INTRAMUSCULAR | Status: DC | PRN

## 2017-09-06 MED ORDER — MORPHINE SULFATE (PF) 2 MG/ML IV SOLN
2.0000 mg | INTRAVENOUS | Status: DC | PRN
  Administered 2017-09-06: 2 mg via INTRAVENOUS
  Filled 2017-09-06: qty 1

## 2017-09-06 MED ORDER — DIPHENHYDRAMINE HCL 12.5 MG/5ML PO ELIX: 12.5000 mg | ORAL_SOLUTION | ORAL | Status: DC | PRN

## 2017-09-06 MED ORDER — MAGNESIUM HYDROXIDE 400 MG/5ML PO SUSP
30.0000 mL | Freq: Every day | ORAL | Status: DC | PRN
  Administered 2017-09-06: 30 mL via ORAL
  Filled 2017-09-06: qty 30

## 2017-09-06 MED ORDER — CELECOXIB 200 MG PO CAPS
200.0000 mg | ORAL_CAPSULE | Freq: Two times a day (BID) | ORAL | Status: DC
  Administered 2017-09-06 – 2017-09-08 (×4): 200 mg via ORAL
  Filled 2017-09-06 (×4): qty 1

## 2017-09-06 MED ORDER — FLEET ENEMA 7-19 GM/118ML RE ENEM: 1.0000 | ENEMA | Freq: Once | RECTAL | Status: DC | PRN

## 2017-09-06 MED ORDER — CHLORHEXIDINE GLUCONATE 4 % EX LIQD: 60.0000 mL | Freq: Once | CUTANEOUS | Status: DC

## 2017-09-06 MED ORDER — NEOMYCIN-POLYMYXIN B GU 40-200000 IR SOLN
Status: DC | PRN
  Administered 2017-09-06: 14 mL

## 2017-09-06 MED ORDER — ACETAMINOPHEN 10 MG/ML IV SOLN
INTRAVENOUS | Status: DC | PRN
Start: 2017-09-06 — End: 2017-09-06
  Administered 2017-09-06: 1000 mg via INTRAVENOUS

## 2017-09-06 MED ORDER — SODIUM CHLORIDE 0.9 % IV SOLN
INTRAVENOUS | Status: DC
  Administered 2017-09-06 (×2): via INTRAVENOUS

## 2017-09-06 MED ORDER — TRAMADOL HCL 50 MG PO TABS
50.0000 mg | ORAL_TABLET | ORAL | Status: DC | PRN
  Administered 2017-09-06 – 2017-09-08 (×2): 100 mg via ORAL
  Filled 2017-09-06 (×2): qty 2

## 2017-09-06 MED ORDER — METOCLOPRAMIDE HCL 10 MG PO TABS
10.0000 mg | ORAL_TABLET | Freq: Three times a day (TID) | ORAL | Status: DC
  Administered 2017-09-06 – 2017-09-08 (×6): 10 mg via ORAL
  Filled 2017-09-06 (×7): qty 1

## 2017-09-06 MED ORDER — FAMOTIDINE 20 MG PO TABS
20.0000 mg | ORAL_TABLET | Freq: Once | ORAL | Status: AC
  Administered 2017-09-06: 20 mg via ORAL

## 2017-09-06 MED ORDER — CEFAZOLIN SODIUM-DEXTROSE 2-4 GM/100ML-% IV SOLN: 2.0000 g | Freq: Four times a day (QID) | INTRAVENOUS | Status: DC

## 2017-09-06 MED ORDER — PANTOPRAZOLE SODIUM 40 MG PO TBEC
40.0000 mg | DELAYED_RELEASE_TABLET | Freq: Two times a day (BID) | ORAL | Status: DC
  Administered 2017-09-06 – 2017-09-08 (×4): 40 mg via ORAL
  Filled 2017-09-06 (×4): qty 1

## 2017-09-06 MED ORDER — SODIUM CHLORIDE 0.9 % IV SOLN
INTRAVENOUS | Status: DC | PRN
  Administered 2017-09-06: 60 mL

## 2017-09-06 MED ORDER — OXYCODONE HCL 5 MG PO TABS
10.0000 mg | ORAL_TABLET | ORAL | Status: DC | PRN
  Administered 2017-09-06 – 2017-09-07 (×3): 10 mg via ORAL
  Filled 2017-09-06 (×3): qty 2

## 2017-09-06 MED ORDER — VITAMIN D (ERGOCALCIFEROL) 1.25 MG (50000 UNIT) PO CAPS
50000.0000 [IU] | ORAL_CAPSULE | ORAL | Status: DC
  Administered 2017-09-08: 50000 [IU] via ORAL
  Filled 2017-09-06: qty 1

## 2017-09-06 MED ORDER — HYDROCHLOROTHIAZIDE 12.5 MG PO CAPS
12.5000 mg | ORAL_CAPSULE | Freq: Every day | ORAL | Status: DC
  Administered 2017-09-08: 12.5 mg via ORAL
  Filled 2017-09-06: qty 1

## 2017-09-06 MED ORDER — PROPOFOL 500 MG/50ML IV EMUL
INTRAVENOUS | Status: DC | PRN
  Administered 2017-09-06: 70 ug/kg/min via INTRAVENOUS

## 2017-09-06 MED ORDER — TRANEXAMIC ACID 1000 MG/10ML IV SOLN
1000.0000 mg | Freq: Once | INTRAVENOUS | Status: AC
  Administered 2017-09-06: 1000 mg via INTRAVENOUS
  Filled 2017-09-06: qty 10

## 2017-09-06 MED ORDER — BISACODYL 10 MG RE SUPP: 10.0000 mg | Freq: Every day | RECTAL | Status: DC | PRN

## 2017-09-06 MED ORDER — HYDROCHLOROTHIAZIDE 12.5 MG PO CAPS
12.5000 mg | ORAL_CAPSULE | Freq: Every day | ORAL | Status: DC
  Administered 2017-09-06: 12.5 mg via ORAL
  Filled 2017-09-06 (×3): qty 1

## 2017-09-06 MED ORDER — BUPIVACAINE HCL (PF) 0.25 % IJ SOLN
INTRAMUSCULAR | Status: AC
  Filled 2017-09-06: qty 60

## 2017-09-06 MED ORDER — PROPOFOL 10 MG/ML IV BOLUS
INTRAVENOUS | Status: DC | PRN
  Administered 2017-09-06 (×3): 20 mg via INTRAVENOUS

## 2017-09-06 MED ORDER — FENTANYL CITRATE (PF) 100 MCG/2ML IJ SOLN
INTRAMUSCULAR | Status: AC
  Filled 2017-09-06: qty 2

## 2017-09-06 MED ORDER — ACETAMINOPHEN 650 MG RE SUPP: 650.0000 mg | RECTAL | Status: DC | PRN

## 2017-09-06 MED ORDER — NEOMYCIN-POLYMYXIN B GU 40-200000 IR SOLN
Status: AC
  Filled 2017-09-06: qty 20

## 2017-09-06 MED ORDER — LISINOPRIL 20 MG PO TABS
20.0000 mg | ORAL_TABLET | Freq: Every day | ORAL | Status: DC
  Administered 2017-09-06 – 2017-09-08 (×2): 20 mg via ORAL
  Filled 2017-09-06 (×3): qty 1

## 2017-09-06 MED ORDER — FENTANYL CITRATE (PF) 100 MCG/2ML IJ SOLN
INTRAMUSCULAR | Status: AC
  Administered 2017-09-06: 50 ug via INTRAVENOUS
  Filled 2017-09-06: qty 2

## 2017-09-06 MED ORDER — ACETAMINOPHEN 10 MG/ML IV SOLN
1000.0000 mg | Freq: Four times a day (QID) | INTRAVENOUS | Status: AC
  Administered 2017-09-06 – 2017-09-07 (×4): 1000 mg via INTRAVENOUS
  Filled 2017-09-06 (×4): qty 100

## 2017-09-06 MED ORDER — DEXTROSE 5 % IV SOLN
Freq: Four times a day (QID) | INTRAVENOUS | Status: AC
  Administered 2017-09-06 – 2017-09-07 (×4): via INTRAVENOUS
  Filled 2017-09-06 (×4): qty 20

## 2017-09-06 MED ORDER — ACETAMINOPHEN 325 MG PO TABS
650.0000 mg | ORAL_TABLET | ORAL | Status: DC | PRN
  Administered 2017-09-08: 650 mg via ORAL
  Filled 2017-09-06: qty 2

## 2017-09-06 MED ORDER — FAMOTIDINE 20 MG PO TABS
ORAL_TABLET | ORAL | Status: AC
  Administered 2017-09-06: 20 mg via ORAL
  Filled 2017-09-06: qty 1

## 2017-09-06 MED ORDER — KETAMINE HCL 50 MG/ML IJ SOLN
INTRAMUSCULAR | Status: DC | PRN
Start: 2017-09-06 — End: 2017-09-06
  Administered 2017-09-06 (×2): 25 mg via INTRAMUSCULAR

## 2017-09-06 MED ORDER — FENTANYL CITRATE (PF) 100 MCG/2ML IJ SOLN
25.0000 ug | INTRAMUSCULAR | Status: DC | PRN
  Administered 2017-09-06: 50 ug via INTRAVENOUS

## 2017-09-06 MED ORDER — SENNOSIDES-DOCUSATE SODIUM 8.6-50 MG PO TABS
1.0000 | ORAL_TABLET | Freq: Two times a day (BID) | ORAL | Status: DC
  Administered 2017-09-06 – 2017-09-08 (×4): 1 via ORAL
  Filled 2017-09-06 (×4): qty 1

## 2017-09-06 MED ORDER — BUPIVACAINE LIPOSOME 1.3 % IJ SUSP
INTRAMUSCULAR | Status: AC
  Filled 2017-09-06: qty 20

## 2017-09-06 MED ORDER — BUPIVACAINE HCL (PF) 0.5 % IJ SOLN
INTRAMUSCULAR | Status: DC | PRN
  Administered 2017-09-06: 2.7 mL

## 2017-09-06 MED ORDER — SODIUM CHLORIDE 0.9 % IV SOLN
INTRAVENOUS | Status: DC | PRN
  Administered 2017-09-06: 40 ug/min via INTRAVENOUS
  Administered 2017-09-06: 30 ug/min via INTRAVENOUS

## 2017-09-06 MED ORDER — PHENOL 1.4 % MT LIQD
1.0000 | OROMUCOSAL | Status: DC | PRN
  Filled 2017-09-06: qty 177

## 2017-09-06 MED ORDER — MIDAZOLAM HCL 5 MG/5ML IJ SOLN
INTRAMUSCULAR | Status: DC | PRN
  Administered 2017-09-06 (×2): 1 mg via INTRAVENOUS

## 2017-09-06 MED ORDER — FENTANYL CITRATE (PF) 100 MCG/2ML IJ SOLN
INTRAMUSCULAR | Status: DC | PRN
  Administered 2017-09-06 (×2): 50 ug via INTRAVENOUS

## 2017-09-06 MED ORDER — DEXAMETHASONE SODIUM PHOSPHATE 4 MG/ML IJ SOLN
INTRAMUSCULAR | Status: DC | PRN
  Administered 2017-09-06: 5 mg via INTRAVENOUS

## 2017-09-06 SURGICAL SUPPLY — 66 items
BATTERY INSTRU NAVIGATION (MISCELLANEOUS) ×8 IMPLANT
BLADE SAW 1 (BLADE) ×2 IMPLANT
BLADE SAW 1/2 (BLADE) ×2 IMPLANT
BLADE SAW 70X12.5 (BLADE) IMPLANT
BONE CEMENT GENTAMICIN (Cement) ×4 IMPLANT
CANISTER SUCT 1200ML W/VALVE (MISCELLANEOUS) ×2 IMPLANT
CANISTER SUCT 3000ML PPV (MISCELLANEOUS) ×4 IMPLANT
CAPT KNEE TOTAL 3 ATTUNE ×2 IMPLANT
CATH TRAY METER 16FR LF (MISCELLANEOUS) ×2 IMPLANT
CEMENT BONE GENTAMICIN 40 (Cement) ×2 IMPLANT
COOLER POLAR GLACIER W/PUMP (MISCELLANEOUS) ×2 IMPLANT
CUFF TOURN 24 STER (MISCELLANEOUS) IMPLANT
CUFF TOURN 30 STER DUAL PORT (MISCELLANEOUS) ×2 IMPLANT
DRAPE SHEET LG 3/4 BI-LAMINATE (DRAPES) ×2 IMPLANT
DRAPE U-SHAPE 47X51 STRL (DRAPES) ×2 IMPLANT
DRSG DERMACEA 8X12 NADH (GAUZE/BANDAGES/DRESSINGS) ×2 IMPLANT
DRSG OPSITE POSTOP 4X14 (GAUZE/BANDAGES/DRESSINGS) ×2 IMPLANT
DRSG TEGADERM 4X4.75 (GAUZE/BANDAGES/DRESSINGS) ×2 IMPLANT
DURAPREP 26ML APPLICATOR (WOUND CARE) ×4 IMPLANT
ELECT CAUTERY BLADE 6.4 (BLADE) ×2 IMPLANT
ELECT REM PT RETURN 9FT ADLT (ELECTROSURGICAL) ×2
ELECTRODE REM PT RTRN 9FT ADLT (ELECTROSURGICAL) ×1 IMPLANT
EVACUATOR 1/8 PVC DRAIN (DRAIN) ×2 IMPLANT
EX-PIN ORTHOLOCK NAV 4X150 (PIN) ×4 IMPLANT
GLOVE BIOGEL M STRL SZ7.5 (GLOVE) ×4 IMPLANT
GLOVE BIOGEL PI IND STRL 7.5 (GLOVE) ×6 IMPLANT
GLOVE BIOGEL PI IND STRL 9 (GLOVE) ×1 IMPLANT
GLOVE BIOGEL PI INDICATOR 7.5 (GLOVE) ×6
GLOVE BIOGEL PI INDICATOR 9 (GLOVE) ×1
GLOVE INDICATOR 8.0 STRL GRN (GLOVE) ×2 IMPLANT
GLOVE SURG SYN 9.0  PF PI (GLOVE) ×1
GLOVE SURG SYN 9.0 PF PI (GLOVE) ×1 IMPLANT
GOWN STRL REUS W/ TWL LRG LVL3 (GOWN DISPOSABLE) ×3 IMPLANT
GOWN STRL REUS W/TWL 2XL LVL3 (GOWN DISPOSABLE) ×2 IMPLANT
GOWN STRL REUS W/TWL LRG LVL3 (GOWN DISPOSABLE) ×3
HOLDER FOLEY CATH W/STRAP (MISCELLANEOUS) ×2 IMPLANT
HOOD PEEL AWAY FLYTE STAYCOOL (MISCELLANEOUS) ×4 IMPLANT
KIT RM TURNOVER STRD PROC AR (KITS) ×2 IMPLANT
KNIFE SCULPS 14X20 (INSTRUMENTS) ×2 IMPLANT
LABEL OR SOLS (LABEL) ×2 IMPLANT
NDL SAFETY 18GX1.5 (NEEDLE) ×2 IMPLANT
NEEDLE SPNL 20GX3.5 QUINCKE YW (NEEDLE) ×4 IMPLANT
NS IRRIG 500ML POUR BTL (IV SOLUTION) ×2 IMPLANT
PACK TOTAL KNEE (MISCELLANEOUS) ×2 IMPLANT
PAD WRAPON POLAR KNEE (MISCELLANEOUS) ×1 IMPLANT
PIN DRILL QUICK PACK ×2 IMPLANT
PIN FIXATION 1/8DIA X 3INL (PIN) ×2 IMPLANT
PULSAVAC PLUS IRRIG FAN TIP (DISPOSABLE) ×2
SOL .9 NS 3000ML IRR  AL (IV SOLUTION) ×1
SOL .9 NS 3000ML IRR UROMATIC (IV SOLUTION) ×1 IMPLANT
SOL PREP PVP 2OZ (MISCELLANEOUS) ×2
SOLUTION PREP PVP 2OZ (MISCELLANEOUS) ×1 IMPLANT
SPONGE DRAIN TRACH 4X4 STRL 2S (GAUZE/BANDAGES/DRESSINGS) ×2 IMPLANT
STAPLER SKIN PROX 35W (STAPLE) ×2 IMPLANT
STRAP TIBIA SHORT (MISCELLANEOUS) ×2 IMPLANT
SUCTION FRAZIER HANDLE 10FR (MISCELLANEOUS) ×1
SUCTION TUBE FRAZIER 10FR DISP (MISCELLANEOUS) ×1 IMPLANT
SUT VIC AB 0 CT1 36 (SUTURE) ×2 IMPLANT
SUT VIC AB 1 CT1 36 (SUTURE) ×4 IMPLANT
SUT VIC AB 2-0 CT2 27 (SUTURE) ×2 IMPLANT
SYR 20CC LL (SYRINGE) ×2 IMPLANT
SYR 30ML LL (SYRINGE) ×4 IMPLANT
TIP FAN IRRIG PULSAVAC PLUS (DISPOSABLE) ×1 IMPLANT
TOWEL OR 17X26 4PK STRL BLUE (TOWEL DISPOSABLE) ×2 IMPLANT
TOWER CARTRIDGE SMART MIX (DISPOSABLE) ×2 IMPLANT
WRAPON POLAR PAD KNEE (MISCELLANEOUS) ×2

## 2017-09-06 NOTE — Anesthesia Post-op Follow-up Note (Signed)
Anesthesia QCDR form completed.        

## 2017-09-06 NOTE — Anesthesia Preprocedure Evaluation (Signed)
Anesthesia Evaluation  Patient identified by MRN, date of birth, ID band Patient awake    Reviewed: Allergy & Precautions, H&P , NPO status , Patient's Chart, lab work & pertinent test results  History of Anesthesia Complications Negative for: history of anesthetic complications  Airway Mallampati: III  TM Distance: <3 FB Neck ROM: limited    Dental  (+) Chipped, Missing, Edentulous Upper, Edentulous Lower, Upper Dentures, Lower Dentures   Pulmonary neg shortness of breath, former smoker,           Cardiovascular Exercise Tolerance: Good hypertension, (-) angina+ Peripheral Vascular Disease  (-) Past MI and (-) DOE      Neuro/Psych PSYCHIATRIC DISORDERS Depression negative neurological ROS  negative psych ROS   GI/Hepatic negative GI ROS, Neg liver ROS,   Endo/Other  diabetes, Type 2  Renal/GU      Musculoskeletal  (+) Arthritis ,   Abdominal   Peds  Hematology negative hematology ROS (+)   Anesthesia Other Findings Past Medical History: No date: Arthritis No date: Cancer ([place])     Comment:  right arm - skin melanoma No date: Depression     Comment:  with loss of son No date: Diabetes mellitus without complication ([place])     Comment:  prediabetic 2014: Hyperlipidemia No date: Hypertension No date: Pain     Comment:  right knee No date: Pre-diabetes No date: PVD (peripheral vascular disease) ([place])  Past Surgical History: No date: BREAST CYST ASPIRATION; Right     Comment:  cyst No date: FRACTURE SURGERY     Comment:  age 71 yrs 11/09/2016: JOINT REPLACEMENT; Right october 2013: LASER ABLATION     Comment:  venous ulcer No date: SKIN CANCER EXCISION  BMI    Body Mass Index:  36.61 kg/m      Reproductive/Obstetrics negative OB ROS                             Anesthesia Physical Anesthesia Plan  ASA: III  Anesthesia Plan: Spinal   Post-op Pain Management:     Induction:   PONV Risk Score and Plan: 2  Airway Management Planned: Natural Airway and Nasal Cannula  Additional Equipment:   Intra-op Plan:   Post-operative Plan:   Informed Consent: I have reviewed the patients History and Physical, chart, labs and discussed the procedure including the risks, benefits and alternatives for the proposed anesthesia with the patient or authorized representative who has indicated his/her understanding and acceptance.   Dental Advisory Given  Plan Discussed with: Anesthesiologist, CRNA and Surgeon  Anesthesia Plan Comments: (Patient reports no bleeding problems and no anticoagulant use.  Plan for spinal with backup GA  Patient consented for risks of anesthesia including but not limited to:  - adverse reactions to medications - risk of bleeding, infection, nerve damage and headache - risk of failed spinal - damage to teeth, lips or other oral mucosa - sore throat or hoarseness - Damage to heart, brain, lungs or loss of life  Patient voiced understanding.)        Anesthesia Quick Evaluation

## 2017-09-06 NOTE — Op Note (Addendum)
OPERATIVE NOTE  DATE OF SURGERY:  09/06/2017  PATIENT NAME:  Kendra Downs   DOB: June 08, 1946  MRN: [id number]  PRE-OPERATIVE DIAGNOSIS: Degenerative arthrosis of the left knee, primary  POST-OPERATIVE DIAGNOSIS:  Same  PROCEDURE:  Left total knee arthroplasty using computer-assisted navigation  SURGEON:  Marciano Sequin. M.D.  ASSISTANT:  Vance Peper, PA (present and scrubbed throughout the case, critical for assistance with exposure, retraction, instrumentation, and closure)  ANESTHESIA: spinal  ESTIMATED BLOOD LOSS: 200 mL  FLUIDS REPLACED: 1400 mL of crystalloid  TOURNIQUET TIME: 93 minutes  DRAINS: 2 medium Hemovac drains  SOFT TISSUE RELEASES: Anterior cruciate ligament, posterior cruciate ligament, deep medial collateral ligament, patellofemoral ligament  IMPLANTS UTILIZED: DePuy Attune size 4 posterior stabilized femoral component (cemented), size 5 rotating platform tibial component (cemented), 35 mm medialized dome patella (cemented), and a 7 mm stabilized rotating platform polyethylene insert.  INDICATIONS FOR SURGERY: Kendra Downs is a 71 y.o. year old female with a long history of progressive knee pain. X-rays demonstrated severe degenerative changes in tricompartmental fashion. The patient had not seen any significant improvement despite conservative nonsurgical intervention. After discussion of the risks and benefits of surgical intervention, the patient expressed understanding of the risks benefits and agree with plans for total knee arthroplasty.   The risks, benefits, and alternatives were discussed at length including but not limited to the risks of infection, bleeding, nerve injury, stiffness, blood clots, the need for revision surgery, cardiopulmonary complications, among others, and they were willing to proceed.  PROCEDURE IN DETAIL: The patient was brought into the operating room and, after adequate spinal anesthesia was achieved, a tourniquet was  placed on the patient's upper thigh. The patient's knee and leg were cleaned and prepped with alcohol and DuraPrep and draped in the usual sterile fashion. A "timeout" was performed as per usual protocol. The lower extremity was exsanguinated using an Esmarch, and the tourniquet was inflated to 300 mmHg. An anterior longitudinal incision was made followed by a standard mid vastus approach. The deep fibers of the medial collateral ligament were elevated in a subperiosteal fashion off of the medial flare of the tibia so as to maintain a continuous soft tissue sleeve. The patella was subluxed laterally and the patellofemoral ligament was incised. Inspection of the knee demonstrated severe degenerative changes with full-thickness loss of articular cartilage. Osteophytes were debrided using a rongeur. Anterior and posterior cruciate ligaments were excised. Two 4.0 mm Schanz pins were inserted in the femur and into the tibia for attachment of the array of trackers used for computer-assisted navigation. Hip center was identified using a circumduction technique. Distal landmarks were mapped using the computer. The distal femur and proximal tibia were mapped using the computer. The distal femoral cutting guide was positioned using computer-assisted navigation so as to achieve a 5 distal valgus cut. The femur was sized and it was felt that a size 4 femoral component was appropriate. A size 4 femoral cutting guide was positioned and the anterior cut was performed and verified using the computer. This was followed by completion of the posterior and chamfer cuts. Femoral cutting guide for the central box was then positioned in the center box cut was performed.  Attention was then directed to the proximal tibia. Medial and lateral menisci were excised. The extramedullary tibial cutting guide was positioned using computer-assisted navigation so as to achieve a 0 varus-valgus alignment and 3 posterior slope. The cut was  performed and verified using the computer. The proximal tibia  was sized and it was felt that a size 5 tibial tray was appropriate. Tibial and femoral trials were inserted followed by insertion of a 7 mm polyethylene insert. This allowed for excellent mediolateral soft tissue balancing both in flexion and in full extension. Finally, the patella was cut and prepared so as to accommodate a 35 mm medialized dome patella. A patella trial was placed and the knee was placed through a range of motion with excellent patellar tracking appreciated. The femoral trial was removed after debridement of posterior osteophytes. The central post-hole for the tibial component was reamed followed by insertion of a keel punch. Tibial trials were then removed. Cut surfaces of bone were irrigated with copious amounts of normal saline with antibiotic solution using pulsatile lavage and then suctioned dry. Polymethylmethacrylate cement with gentamicin was prepared in the usual fashion using a vacuum mixer. Cement was applied to the cut surface of the proximal tibia as well as along the undersurface of a size 5 rotating platform tibial component. Tibial component was positioned and impacted into place. Excess cement was removed using [employer] fast streamer. Cement was then applied to the cut surfaces of the femur as well as along the posterior flanges of the size 4 femoral component. The femoral component was positioned and impacted into place. Excess cement was removed using [employer] fast streamer. A 7 mm polyethylene trial was inserted and the knee was brought into full extension with steady axial compression applied. Finally, cement was applied to the backside of a 35 mm medialized dome patella and the patellar component was positioned and patellar clamp applied. Excess cement was removed using [employer] fast streamer. After adequate curing of the cement, the tourniquet was deflated after a total tourniquet time of 93 minutes. Hemostasis was achieved using  electrocautery. The knee was irrigated with copious amounts of normal saline with antibiotic solution using pulsatile lavage and then suctioned dry. 20 mL of 1.3% Exparel and 60 mL of 0.25% Marcaine in 40 mL of normal saline was injected along the posterior capsule, medial and lateral gutters, and along the arthrotomy site. A 7 mm stabilized rotating platform polyethylene insert was inserted and the knee was placed through a range of motion with excellent mediolateral soft tissue balancing appreciated and excellent patellar tracking noted. 2 medium drains were placed in the wound bed and brought out through separate stab incisions. The medial parapatellar portion of the incision was reapproximated using interrupted sutures of #1 Vicryl. Subcutaneous tissue was approximated in layers using first #0 Vicryl followed #2-0 Vicryl. The skin was approximated with skin staples. A sterile dressing was applied.  The patient tolerated the procedure well and was transported to the recovery room in stable condition.    Keileigh Vahey P. Holley Bouche., M.D.

## 2017-09-06 NOTE — Transfer of Care (Signed)
Immediate Anesthesia Transfer of Care Note  Patient: STACEE EARP  Procedure(s) Performed: COMPUTER ASSISTED TOTAL KNEE ARTHROPLASTY (Left Knee)  Patient Location: PACU  Anesthesia Type:Spinal  Level of Consciousness: awake, alert  and oriented  Airway & Oxygen Therapy: Patient Spontanous Breathing and Patient connected to nasal cannula oxygen  Post-op Assessment: Report given to RN and Post -op Vital signs reviewed and stable  Post vital signs: Reviewed and stable  Last Vitals:  Vitals:   09/06/17 1017 09/06/17 1538  BP: (!) 152/64 115/90  Pulse: (!) 57 (!) 56  Resp: 17 11  Temp: 36.4 C 36.5 C  SpO2: 98% 100%    Last Pain:  Vitals:   09/06/17 1017  TempSrc: Oral  PainSc: 6          Complications: No apparent anesthesia complications

## 2017-09-06 NOTE — Anesthesia Procedure Notes (Signed)
Spinal  Patient location during procedure: OR Start time: 09/06/2017 11:44 AM End time: 09/06/2017 11:49 AM Staffing Performed: resident/CRNA  Preanesthetic Checklist Completed: patient identified, site marked, surgical consent, pre-op evaluation, timeout performed, IV checked, risks and benefits discussed and monitors and equipment checked Spinal Block Patient position: sitting Prep: ChloraPrep Patient monitoring: heart rate, continuous pulse ox, blood pressure and cardiac monitor Approach: midline Location: L4-5 Injection technique: single-shot Needle Needle type: Introducer and Pencan  Needle gauge: 24 G Needle length: 9 cm Additional Notes Negative paresthesia. Negative blood return. Positive free-flowing CSF. Expiration date of kit checked and confirmed. Patient tolerated procedure well, without complications.

## 2017-09-06 NOTE — H&P (Signed)
The patient has been re-examined, and the chart reviewed, and there have been no interval changes to the documented history and physical.    The risks, benefits, and alternatives have been discussed at length. The patient expressed understanding of the risks benefits and agreed with plans for surgical intervention.  Dallyn Bergland P. Towanda Hornstein, Jr. M.D.    

## 2017-09-07 ENCOUNTER — Other Ambulatory Visit: Payer: Self-pay

## 2017-09-07 ENCOUNTER — Encounter: Payer: Self-pay | Admitting: Orthopedic Surgery

## 2017-09-07 MED ORDER — ACETAMINOPHEN 10 MG/ML IV SOLN
INTRAVENOUS | Status: AC
  Filled 2017-09-07: qty 100

## 2017-09-07 MED ORDER — TRAMADOL HCL 50 MG PO TABS: 50.0000 mg | ORAL_TABLET | ORAL | 0 refills | Status: DC | PRN

## 2017-09-07 MED ORDER — GLYCOPYRROLATE 0.2 MG/ML IJ SOLN
INTRAMUSCULAR | Status: AC
Start: 2017-09-07 — End: ?
  Filled 2017-09-07: qty 1

## 2017-09-07 MED ORDER — ENOXAPARIN SODIUM 40 MG/0.4ML ~~LOC~~ SOLN: 40.0000 mg | SUBCUTANEOUS | 0 refills | Status: DC

## 2017-09-07 MED ORDER — NEOSTIGMINE METHYLSULFATE 10 MG/10ML IV SOLN
INTRAVENOUS | Status: AC
  Filled 2017-09-07: qty 1

## 2017-09-07 MED ORDER — ENOXAPARIN SODIUM 40 MG/0.4ML ~~LOC~~ SOLN: 40.0000 mg | SUBCUTANEOUS | Status: DC

## 2017-09-07 MED ORDER — OXYCODONE HCL 5 MG PO TABS: 5.0000 mg | ORAL_TABLET | ORAL | 0 refills | Status: DC | PRN

## 2017-09-07 NOTE — Discharge Summary (Signed)
Physician Discharge Summary  Patient ID: Kendra Downs MRN: [id number] DOB/AGE: 12-25-1945 71 y.o.  Admit date: 09/06/2017 Discharge date: 09/08/2017  Admission Diagnoses:  S/P total knee arthroplasty [Z96.659]   Discharge Diagnoses: Patient Active Problem List   Diagnosis Date Noted  . S/P total knee arthroplasty 11/09/2016  . Diabetes type 2, controlled ([place]) 08/12/2015  . Arthritis of knee, degenerative 05/18/2014    Past Medical History:  Diagnosis Date  . Arthritis   . Cancer ([place])    right arm - skin melanoma  . Depression    with loss of son  . Diabetes mellitus without complication ([facility])    prediabetic  . Hyperlipidemia 2014  . Hypertension   . Pain    right knee  . Pre-diabetes   . PVD (peripheral vascular disease) ([place])      Transfusion: None   Consultants (if any):   Discharged Condition: Improved  Hospital Course: Kendra Downs is an 71 y.o. female who was admitted 09/06/2017 with a diagnosis of degenerative arthrosis left knee and went to the operating room on 09/06/2017 and underwent the above named procedures.    Surgeries:Procedure(s): COMPUTER ASSISTED TOTAL KNEE ARTHROPLASTY on 09/06/2017  PRE-OPERATIVE DIAGNOSIS: Degenerative arthrosis of the left knee, primary  POST-OPERATIVE DIAGNOSIS:  Same  PROCEDURE:  Left total knee arthroplasty using computer-assisted navigation  SURGEON:  Marciano Sequin. M.D.  ASSISTANT:  Vance Peper, PA (present and scrubbed throughout the case, critical for assistance with exposure, retraction, instrumentation, and closure)  ANESTHESIA: spinal  ESTIMATED BLOOD LOSS: 200 mL  FLUIDS REPLACED: 1400 mL of crystalloid  TOURNIQUET TIME: 93 minutes  DRAINS: 2 medium Hemovac drains  SOFT TISSUE RELEASES: Anterior cruciate ligament, posterior cruciate ligament, deep medial collateral ligament, patellofemoral ligament  IMPLANTS UTILIZED: DePuy Attune size 4 posterior stabilized femoral  component (cemented), size 5 rotating platform tibial component (cemented), 35 mm medialized dome patella (cemented), and a 7 mm stabilized rotating platform polyethylene insert.   IMPLANTS UTILIZED: DePuy Attune size 4 posterior stabilized femoral component (cemented), size 5 rotating platform tibial component (cemented), 35 mm medialized dome patella (cemented), and a 7 mm stabilized rotating platform polyethylene insert.  INDICATIONS FOR SURGERY: Kendra Downs is a 71 y.o. year old female with a long history of progressive knee pain. X-rays demonstrated severe degenerative changes in tricompartmental fashion. The patient had not seen any significant improvement despite conservative nonsurgical intervention. After discussion of the risks and benefits of surgical intervention, the patient expressed understanding of the risks benefits and agree with plans for total knee arthroplasty.   The risks, benefits, and alternatives were discussed at length including but not limited to the risks of infection, bleeding, nerve injury, stiffness, blood clots, the need for revision surgery, cardiopulmonary complications, among others, and they were willing to proceed.  Patient tolerated the surgery well. No complications .Patient was taken to PACU where she was stabilized and then transferred to the orthopedic floor.  Patient started on Lovenox 30 mg q 12 hrs. Foot pumps applied bilaterally at 80 mm hgb. Heels elevated off bed with rolled towels. No evidence of DVT. Calves non tender. Negative Homan. Physical therapy started on day #1 for gait training and transfer with OT starting on  day #1 for ADL and assisted devices. Patient has done well with therapy. Ambulated greater than 200 feet upon being discharged. Was able to ascend and descend 4 steps safely and independently  Patient's IV And Foley were discontinued on day #1 with Hemovac being discontinued  on day #2. Dressing was changed on day 2 prior to  patient being discharged   She was given perioperative antibiotics:  Anti-infectives (From admission, onward)   Start     Dose/Rate Route Frequency Ordered Stop   09/06/17 1800  ceFAZolin (ANCEF) 2 g in dextrose 5 % 100 mL injection      Intravenous Every 6 hours 09/06/17 1732 09/07/17 1759   09/06/17 1730  ceFAZolin (ANCEF) IVPB 2g/100 mL premix  Status:  Discontinued     2 g 200 mL/hr over 30 Minutes Intravenous Every 6 hours 09/06/17 1717 09/06/17 1731   09/06/17 1008  ceFAZolin (ANCEF) 2-4 GM/100ML-% IVPB    Comments:  Kendra Downs, Kendra Downs   : cabinet override      09/06/17 1008 09/06/17 1200   09/06/17 0600  ceFAZolin (ANCEF) IVPB 2g/100 mL premix     2 g 200 mL/hr over 30 Minutes Intravenous On call to O.R. 09/05/17 2215 09/06/17 1210    .  She was fitted with AV 1 compression foot pump devices, instructed on heel pumps, early ambulation, and fitted with TED stockings bilaterally for DVT prophylaxis.  She benefited maximally from the hospital stay and there were no complications.    Recent vital signs:  Vitals:   09/06/17 2340 09/07/17 0405  BP: (!) 121/54 (!) 111/56  Pulse: (!) 57 (!) 48  Resp: 16 16  Temp: 97.7 F (36.5 C) 97.6 F (36.4 C)  SpO2: 95% 97%    Recent laboratory studies:  Lab Results  Component Value Date   HGB 13.1 08/23/2017   HGB 9.7 (L) 11/11/2016   HGB 10.1 (L) 11/10/2016   Lab Results  Component Value Date   WBC 5.3 08/23/2017   PLT 196 08/23/2017   Lab Results  Component Value Date   INR 1.01 08/23/2017   Lab Results  Component Value Date   NA 141 08/23/2017   K 3.9 08/23/2017   CL 106 08/23/2017   CO2 27 08/23/2017   BUN 19 08/23/2017   CREATININE 0.66 08/23/2017   GLUCOSE 90 08/23/2017    Discharge Medications:   Allergies as of 09/07/2017   No Known Allergies     Medication List    STOP taking these medications   naproxen sodium 220 MG tablet Commonly known as:  ALEVE     TAKE these medications   enoxaparin 40  MG/0.4ML injection Commonly known as:  LOVENOX Inject 0.4 mLs (40 mg total) daily into the skin. Start taking on:  09/09/2017   etodolac 500 MG tablet Commonly known as:  LODINE Take 500 mg by mouth 2 (two) times daily.   hydrochlorothiazide 12.5 MG capsule Commonly known as:  MICROZIDE Take 12.5 mg by mouth daily.   NUTRITIONAL SUPPLEMENT Liqd Take 1 scoop by mouth daily. Isagenix shake powder multivitamin   oxyCODONE 5 MG immediate release tablet Commonly known as:  Oxy IR/ROXICODONE Take 1 tablet (5 mg total) every 4 (four) hours as needed by mouth for moderate pain ((score 4 to 6)).   quinapril-hydrochlorothiazide 20-12.5 MG tablet Commonly known as:  ACCURETIC Take 1 tablet by mouth daily.   traMADol 50 MG tablet Commonly known as:  ULTRAM Take 1-2 tablets (50-100 mg total) every 4 (four) hours as needed by mouth for moderate pain.   Vitamin D (Ergocalciferol) 50000 units Caps capsule Commonly known as:  DRISDOL Take 50,000 Units by mouth every 7 (seven) days. Either Wednesday or Thursday  Durable Medical Equipment  (From admission, onward)        Start     Ordered   09/06/17 1718  DME Walker rolling  Once    Question:  Patient needs a walker to treat with the following condition  Answer:  Total knee replacement status   09/06/17 1717   09/06/17 1718  DME Bedside commode  Once    Question:  Patient needs a bedside commode to treat with the following condition  Answer:  Total knee replacement status   09/06/17 1717      Diagnostic Studies: Dg Knee Left Port  Result Date: 09/06/2017 CLINICAL DATA:  Status post left total knee joint prosthesis placement. EXAM: PORTABLE LEFT KNEE - 1-2 VIEW COMPARISON:  None in PACs FINDINGS: The total knee joint prosthesis appears appropriately positioned. The interface with the native bone appears normal. No acute native bone abnormality is observed. Two surgical drain lines are present. IMPRESSION: No immediate  complication following left total knee joint prosthesis placement. Electronically Signed   By: David  Martinique M.D.   On: 09/06/2017 16:52    Disposition: 06-Home-Health Care Svc  Discharge Instructions    Diet - low sodium heart healthy   Complete by:  As directed    Diet - low sodium heart healthy   Complete by:  As directed    Increase activity slowly   Complete by:  As directed    Increase activity slowly   Complete by:  As directed          Signed: WOLFE,JON R. 09/07/2017, 7:21 AM

## 2017-09-07 NOTE — Patient Outreach (Signed)
[facility]) Care Management  09/07/2017  Kendra Downs November 10, 1945 [id number]   Spoke to patient in the hospital.  She had her left knee replacement yesterday. She tells me she has been up walking and is currently sitting up in a chair.  She will be discharged from hospital tomorrow and her sister has come to town to assist her at home. I will follow up with her when she goes home.    Gentry Fitz, RN, BA, [place], [place] Direct Dial:  [phone]  Fax:  [phone] E-mail: [email or web address] [address] [place], [place]  [address]

## 2017-09-07 NOTE — NC FL2 (Signed)
[place] LEVEL OF CARE SCREENING TOOL     IDENTIFICATION  Patient Name: TANIS HENSARLING Birthdate: 14-Sep-1946 Sex: female Admission Date (Current Location): 09/06/2017  [place] and [place] Number:  Engineering geologist and Address:  [facility], [address], [place], [place] [address]      Provider Number: [id number]  Attending Physician Name and Address:  Dereck Leep, MD  Relative Name and Phone Number:       Current Level of Care: Hospital Recommended Level of Care: [place] Prior Approval Number:    Date Approved/Denied:   PASRR Number: ([id number] )  Discharge Plan: SNF    Current Diagnoses: Patient Active Problem List   Diagnosis Date Noted  . S/P total knee arthroplasty 11/09/2016  . Diabetes type 2, controlled ([place]) 08/12/2015  . Arthritis of knee, degenerative 05/18/2014    Orientation RESPIRATION BLADDER Height & Weight     Self, Time, Situation, Place  Normal Continent Weight: 225 lb (102.1 kg) Height:  5\' 5"  (165.1 cm)  BEHAVIORAL SYMPTOMS/MOOD NEUROLOGICAL BOWEL NUTRITION STATUS      Continent Diet(Regular Diet )  AMBULATORY STATUS COMMUNICATION OF NEEDS Skin   Extensive Assist Verbally Surgical wounds(Incision: Left Knee. )                       Personal Care Assistance Level of Assistance  Bathing, Feeding, Dressing Bathing Assistance: Limited assistance Feeding assistance: Independent Dressing Assistance: Limited assistance     Functional Limitations Info  Sight, Hearing, Speech Sight Info: Adequate Hearing Info: Adequate Speech Info: Adequate    SPECIAL CARE FACTORS FREQUENCY  PT (By licensed PT), OT (By licensed OT)     PT Frequency: (5) OT Frequency: (5)            Contractures      Additional Factors Info  Code Status, Allergies Code Status Info: (Full Code. ) Allergies Info: (No Known Allergies. )           Current Medications (09/07/2017):  This  is the current hospital active medication list Current Facility-Administered Medications  Medication Dose Route Frequency Provider Last Rate Last Dose  . 0.9 %  sodium chloride infusion   Intravenous Continuous Hooten, Laurice Record, MD 100 mL/hr at 09/07/17 0407    . acetaminophen (OFIRMEV) IV 1,000 mg  1,000 mg Intravenous Q6H Hooten, Laurice Record, MD 400 mL/hr at 09/07/17 0520 1,000 mg at 09/07/17 0520  . acetaminophen (TYLENOL) tablet 650 mg  650 mg Oral Q4H PRN Hooten, Laurice Record, MD       Or  . acetaminophen (TYLENOL) suppository 650 mg  650 mg Rectal Q4H PRN Hooten, Laurice Record, MD      . alum & mag hydroxide-simeth (MAALOX/MYLANTA) 200-200-20 MG/5ML suspension 30 mL  30 mL Oral Q4H PRN Hooten, Laurice Record, MD      . bisacodyl (DULCOLAX) suppository 10 mg  10 mg Rectal Daily PRN Hooten, Laurice Record, MD      . ceFAZolin (ANCEF) 2 g in dextrose 5 % 100 mL injection   Intravenous Q6H Hooten, Laurice Record, MD      . celecoxib (CELEBREX) capsule 200 mg  200 mg Oral Q12H Hooten, Laurice Record, MD   200 mg at 09/07/17 0813  . diphenhydrAMINE (BENADRYL) 12.5 MG/5ML elixir 12.5-25 mg  12.5-25 mg Oral Q4H PRN Hooten, Laurice Record, MD      . enoxaparin (LOVENOX) injection 30 mg  30 mg Subcutaneous Q12H Hooten,  Laurice Record, MD   30 mg at 09/07/17 0813  . [START ON 09/09/2017] enoxaparin (LOVENOX) injection 40 mg  40 mg Subcutaneous Q24H Vance Peper R, PA      . ferrous sulfate tablet 325 mg  325 mg Oral BID WC Hooten, Laurice Record, MD   325 mg at 09/07/17 0814  . hydrochlorothiazide (MICROZIDE) capsule 12.5 mg  12.5 mg Oral Daily Hooten, Laurice Record, MD   Stopped at 09/07/17 0813  . lisinopril (PRINIVIL,ZESTRIL) tablet 20 mg  20 mg Oral Daily Hooten, Laurice Record, MD   Stopped at 09/07/17 0813   And  . hydrochlorothiazide (MICROZIDE) capsule 12.5 mg  12.5 mg Oral Daily Hooten, Laurice Record, MD      . magnesium hydroxide (MILK OF MAGNESIA) suspension 30 mL  30 mL Oral Daily PRN Hooten, Laurice Record, MD   30 mL at 09/06/17 1749  . menthol-cetylpyridinium (CEPACOL) lozenge  3 mg  1 lozenge Oral PRN Hooten, Laurice Record, MD       Or  . phenol (CHLORASEPTIC) mouth spray 1 spray  1 spray Mouth/Throat PRN Hooten, Laurice Record, MD      . metoCLOPramide (REGLAN) tablet 10 mg  10 mg Oral TID AC & HS Hooten, Laurice Record, MD   10 mg at 09/07/17 0813  . morphine 2 MG/ML injection 2 mg  2 mg Intravenous Q2H PRN Hooten, Laurice Record, MD   2 mg at 09/06/17 1747  . ondansetron (ZOFRAN) tablet 4 mg  4 mg Oral Q6H PRN Hooten, Laurice Record, MD       Or  . ondansetron (ZOFRAN) injection 4 mg  4 mg Intravenous Q6H PRN Hooten, Laurice Record, MD      . oxyCODONE (Oxy IR/ROXICODONE) immediate release tablet 10 mg  10 mg Oral Q3H PRN Dereck Leep, MD   10 mg at 09/07/17 0436  . oxyCODONE (Oxy IR/ROXICODONE) immediate release tablet 5 mg  5 mg Oral Q3H PRN Hooten, Laurice Record, MD      . pantoprazole (PROTONIX) EC tablet 40 mg  40 mg Oral BID Dereck Leep, MD   40 mg at 09/07/17 0814  . senna-docusate (Senokot-S) tablet 1 tablet  1 tablet Oral BID Dereck Leep, MD   1 tablet at 09/07/17 [phone]  . sodium phosphate (FLEET) 7-19 GM/118ML enema 1 enema  1 enema Rectal Once PRN Hooten, Laurice Record, MD      . traMADol Veatrice Bourbon) tablet 50-100 mg  50-100 mg Oral Q4H PRN Dereck Leep, MD   100 mg at 09/06/17 2341  . [START ON 09/08/2017] Vitamin D (Ergocalciferol) (DRISDOL) capsule 50,000 Units  50,000 Units Oral Q7 days Hooten, Laurice Record, MD         Discharge Medications: Please see discharge summary for a list of discharge medications.  Relevant Imaging Results:  Relevant Lab Results:   Additional Information (SSN: [id number])  Marin Wisner, Veronia Beets, LCSW

## 2017-09-07 NOTE — Progress Notes (Signed)
Clinical Social Worker (CSW) received SNF consult. PT is recommending outpatient PT. RN case manager aware of above. Please reconsult if future social work needs arise. CSW signing off.   Amyria Komar, LCSW [phone]  

## 2017-09-07 NOTE — Anesthesia Postprocedure Evaluation (Signed)
Anesthesia Post Note  Patient: BIRTTANY DECHELLIS  Procedure(s) Performed: COMPUTER ASSISTED TOTAL KNEE ARTHROPLASTY (Left Knee)  Patient location during evaluation: Nursing Unit Anesthesia Type: Spinal Level of consciousness: awake, awake and alert and oriented Pain management: pain level controlled Vital Signs Assessment: post-procedure vital signs reviewed and stable Respiratory status: spontaneous breathing, nonlabored ventilation and respiratory function stable Cardiovascular status: blood pressure returned to baseline and stable Postop Assessment: no headache and no backache Anesthetic complications: no     Last Vitals:  Vitals:   09/06/17 2340 09/07/17 0405  BP: (!) 121/54 (!) 111/56  Pulse: (!) 57 (!) 48  Resp: 16 16  Temp: 36.5 C 36.4 C  SpO2: 95% 97%    Last Pain:  Vitals:   09/07/17 0537  TempSrc:   PainSc: Asleep                 Johnna Acosta

## 2017-09-07 NOTE — Progress Notes (Signed)
   Subjective: 1 Day Post-Op Procedure(s) (LRB): COMPUTER ASSISTED TOTAL KNEE ARTHROPLASTY (Left) Patient reports pain as moderate.   Patient is well, and has had no acute complaints or problems We will start therapy today. Nurse sit patient at the site of the bed and dangle her  leg last evening. Did well with this. Plan is to go Home after hospital stay. no nausea and no vomiting Patient denies any chest pains or shortness of breath. Objective: Vital signs in last 24 hours: Temp:  [97.6 F (36.4 C)-98 F (36.7 C)] 97.6 F (36.4 C) (11/06 0405) Pulse Rate:  [48-74] 48 (11/06 0405) Resp:  [0-17] 16 (11/06 0405) BP: (101-152)/(52-123) 111/56 (11/06 0405) SpO2:  [94 %-100 %] 97 % (11/06 0405) Weight:  [99.8 kg (220 lb)-102.1 kg (225 lb)] 102.1 kg (225 lb) (11/06 0405) Heels are non tender and elevated off the bed using rolled towels as well as bone foam under operative leg  Intake/Output from previous day: 11/05 0701 - 11/06 0700 In: 2883.3 [I.V.:2573.3; IV Piggyback:310] Out: 9937 [JIRCV:8938; Drains:320; Blood:200] Intake/Output this shift: No intake/output data recorded.  No results for input(s): HGB in the last 72 hours. No results for input(s): WBC, RBC, HCT, PLT in the last 72 hours. No results for input(s): NA, K, CL, CO2, BUN, CREATININE, GLUCOSE, CALCIUM in the last 72 hours. No results for input(s): LABPT, INR in the last 72 hours.  EXAM General - Patient is Alert, Appropriate, Oriented and Possibly a little sedated Extremity - Neurologically intact Neurovascular intact Sensation intact distally Intact pulses distally Dorsiflexion/Plantar flexion intact Compartment soft Dressing - dressing C/D/I Motor Function - intact, moving foot and toes well on exam.    Past Medical History:  Diagnosis Date  . Arthritis   . Cancer ([place])    right arm - skin melanoma  . Depression    with loss of son  . Diabetes mellitus without complication ([facility])    prediabetic  .  Hyperlipidemia 2014  . Hypertension   . Pain    right knee  . Pre-diabetes   . PVD (peripheral vascular disease) ([facility])     Assessment/Plan: 1 Day Post-Op Procedure(s) (LRB): COMPUTER ASSISTED TOTAL KNEE ARTHROPLASTY (Left) Active Problems:   S/P total knee arthroplasty  Estimated body mass index is 37.44 kg/m as calculated from the following:   Height as of this encounter: 5\' 5"  (1.651 m).   Weight as of this encounter: 102.1 kg (225 lb). Advance diet Up with therapy D/C IV fluids Plan for discharge tomorrow Discharge home with home health  Labs: Not back as of this dictation DVT Prophylaxis - Lovenox, Foot Pumps and TED hose Weight-Bearing as tolerated to left leg D/C O2 and Pulse OX and try on Room Air Begin working on bowel movement Labs tomorrow morning  Jame Seelig R. Stanley Colo 09/07/2017, 7:10 AM

## 2017-09-07 NOTE — Progress Notes (Signed)
PT Cancellation Note  Patient Details Name: Kendra Downs MRN: [id number] DOB: 1946-07-14   Cancelled Treatment:    Reason Eval/Treat Not Completed: Medical issues which prohibited therapy(Consult received and chart reviewed.  Patient extremely lethargic (suspected due to medication); unable to maintain eyes open for adequate participation with session.  Has been slightly hypotensive this date (pressure currently 111/65, HR 51).  Per primary RN, recommends hold on PT evaluation at this time; will continue efforts in PM as patient medically appropriate and able to participate.)   [name] H. Owens Shark, PT, DPT, NCS 09/07/17, 10:51 AM [phone]

## 2017-09-07 NOTE — Care Management (Addendum)
PT recommending out patient physical therapy. Spoke with patient and she is agreeable and prefers [facility]. Appointment scheduled for Thursday, Nov 8 at 3:00 pm. Patient updated. Placed on follow up orders. Patient had visitors in the room at this attempt of assessment. Will reattempt in the AM.

## 2017-09-07 NOTE — Progress Notes (Signed)
OT Cancellation Note  Patient Details Name: Kendra Downs MRN: [id number] DOB: May 28, 1946   Cancelled Treatment:    Reason Eval/Treat Not Completed: Patient declined, no reason specified. Upon attempt, pt recently worked with PT, has company still in room, stating "well since I've been through this before I don't think I need you, but if you want to come back later that would be fine." Will re-attempt at later date/time as pt is available and as schedule permits.  Jeni Salles, MPH, MS, OTR/L ascom [phone] 09/07/17, 2:30 PM

## 2017-09-07 NOTE — Discharge Instructions (Signed)

## 2017-09-07 NOTE — Care Management (Signed)
RNCM attempted assessment. Patient working with PT. Informed her I would reattempt at a later time.

## 2017-09-07 NOTE — Progress Notes (Signed)
POD 1. Pt. Alert and oriented. Pain controlled with PO pain meds. Neuro checks within defined limits. IS at the bedside and pt. Instructed on its use. Polar care on and running. Bone foam in place. Pt. Dangled at the bedside and did great. Pt. Had a good night.

## 2017-09-07 NOTE — Evaluation (Signed)
Physical Therapy Evaluation Patient Details Name: Kendra Downs MRN: [id number] DOB: Dec 17, 1945 Today's Date: 09/07/2017   History of Present Illness  admitted for acute hospitalization status post L TKR (09/06/17), WBAT.  Clinical Impression  Upon evaluation, patient much more alert this PM; follows commands and demonstrates good effort with treatment session.  L knee post-op strength (3-/5) and ROM (0-85 degrees) grossly WFL for basic transfers and mobility; limited by post-op pain/soreness.  Did demonstrate good L quad activation; completing SLR without difficulty, minimal lag noted.  Able to complete bed mobility with mod indep; sit/stand, basic transfers and gait (54') with RW, cga/min assist.  Mild use of momentum to initiate lift off from all seating surfaces; decreased active use of L LE noted. Would benefit from skilled PT to address above deficits and promote optimal return to PLOF; recommend transition to home with outpatient PT follow up.    Follow Up Recommendations Outpatient PT    Equipment Recommendations  (has RW, BSC from previous surgery)    Recommendations for Other Services       Precautions / Restrictions Precautions Precautions: Fall Restrictions Weight Bearing Restrictions: Yes LLE Weight Bearing: Weight bearing as tolerated      Mobility  Bed Mobility Overal bed mobility: Modified Independent                Transfers Overall transfer level: Needs assistance Equipment used: Rolling walker (2 wheeled) Transfers: Sit to/from Stand Sit to Stand: Min guard         General transfer comment: cuing for hand placement; excessive weight shift to R LE, mild use of rocking/momentum to initiate lift off  Ambulation/Gait Ambulation/Gait assistance: Min guard Ambulation Distance (Feet): 75 Feet Assistive device: Rolling walker (2 wheeled)       General Gait Details: step to progressing to step through gait pattern with fair/good stance time and  weight acceptance L LE. Cautious and guarded, but no overt buckling or LOB noted. Min cuing for postural extension, walker management  Stairs            Wheelchair Mobility    Modified Rankin (Stroke Patients Only)       Balance Overall balance assessment: Needs assistance Sitting-balance support: No upper extremity supported;Feet supported Sitting balance-Leahy Scale: Good     Standing balance support: Bilateral upper extremity supported Standing balance-Leahy Scale: Fair                               Pertinent Vitals/Pain Pain Assessment: 0-10 Pain Score: 3  Pain Location: L knee Pain Descriptors / Indicators: Aching;Grimacing;Guarding;Operative site guarding Pain Intervention(s): Limited activity within patient's tolerance;Monitored during session;Repositioned    Home Living Family/patient expects to be discharged to:: Private residence Living Arrangements: Alone Available Help at Discharge: Family;Available 24 hours/day(daughter and grand-daughter planning to stay with patient x2 weeks upon discharge) Type of Home: House Home Access: Stairs to enter Entrance Stairs-Rails: Right Entrance Stairs-Number of Steps: 1 in front, no rails; 5 in back, single (R) rail Home Layout: One level Home Equipment: [place] - 2 wheels;Cane - quad;Bedside commode      Prior Function Level of Independence: Independent         Comments: Indep with ADLs, household and community distances without assist device; working-full time as Editor, [employer]ing [employer]   Dominant Hand: Right    Extremity/Trunk Assessment   Upper Extremity Assessment Upper Extremity Assessment: Overall WFL for tasks assessed  Lower Extremity Assessment Lower Extremity Assessment: Generalized weakness(L knee grossly 3-/5 limited by post-op pain/soreness; otherwise, grossly WFL.  Sensation fully intact bilat.)       Communication   Communication: No difficulties  Cognition  Arousal/Alertness: Awake/alert Behavior During Therapy: WFL for tasks assessed/performed Overall Cognitive Status: Within Functional Limits for tasks assessed                                        General Comments      Exercises Total Joint Exercises Goniometric ROM: L knee: 0-85 degrees, act assist Other Exercises Other Exercises: Supine LE therex, 1x10, AROM for muscular strength/endurance wtih funtional activities: ankle pumps, quad sets, heel slides, hip abduct/adduct, SLR.  Good activation and control of L quad; completing SLR without difficulty, minimal lag noted. Other Exercises: Toilet transfer, ambulatory with RW, cga/close sup; sit/satnd from standard toilet, cga/min assist with grab bar.  Broad BOS, excessive weight shift to R LE.   Assessment/Plan    PT Assessment Patient needs continued PT services  PT Problem List Decreased strength;Decreased range of motion;Decreased activity tolerance;Decreased balance;Decreased mobility;Decreased coordination;Decreased cognition;Decreased knowledge of use of DME;Decreased safety awareness;Pain;Decreased knowledge of precautions       PT Treatment Interventions DME instruction;Gait training;Functional mobility training;Stair training;Therapeutic activities;Therapeutic exercise;Balance training;Patient/family education    PT Goals (Current goals can be found in the Care Plan section)  Acute Rehab PT Goals Patient Stated Goal: to return home with daughter PT Goal Formulation: With patient/family Time For Goal Achievement: 09/21/17 Potential to Achieve Goals: Good    Frequency BID   Barriers to discharge        Co-evaluation               AM-PAC PT "6 Clicks" Daily Activity  Outcome Measure Difficulty turning over in bed (including adjusting bedclothes, sheets and blankets)?: A Little Difficulty moving from lying on back to sitting on the side of the bed? : A Little Difficulty sitting down on and  standing up from a chair with arms (e.g., wheelchair, bedside commode, etc,.)?: Unable Help needed moving to and from a bed to chair (including a wheelchair)?: A Little Help needed walking in hospital room?: A Little Help needed climbing 3-5 steps with a railing? : A Lot 6 Click Score: 15    End of Session Equipment Utilized During Treatment: Gait belt Activity Tolerance: Patient tolerated treatment well Patient left: with call bell/phone within reach;in chair;with chair alarm set;with family/visitor present Nurse Communication: Mobility status PT Visit Diagnosis: Difficulty in walking, not elsewhere classified (R26.2);Muscle weakness (generalized) (M62.81);Pain Pain - Right/Left: Left Pain - part of body: Knee    Time: 7989-2119 PT Time Calculation (min) (ACUTE ONLY): 34 min   Charges:   PT Evaluation $PT Eval Low Complexity: 1 Low PT Treatments $Therapeutic Exercise: 8-22 mins $Therapeutic Activity: 8-22 mins   PT G Codes:        Eric Nees H. Owens Shark, PT, DPT, NCS 09/07/17, 3:05 PM [phone]

## 2017-09-07 NOTE — Care Management Note (Signed)
Case Management Note  Patient Details  Name: DAREN DOSWELL MRN: [id number] Date of Birth: 08-Jan-1946  Subjective/Objective:  Met with patient at bedside. She has a walker at home. Pharmacy: [facility]. TC to [place] for heads up and Lovenox 40 mg # 14 had been sent over by orthopedist. It is anticipated that patient will discharge tomorrow.                   Action/Plan: OP PT at [facility]. No DME, Lovenox will be ready tomorrow.   Expected Discharge Date:  09/08/17               Expected Discharge Plan:  OP Rehab  In-House Referral:     Discharge planning Services  CM Consult  Post Acute Care Choice:    Choice offered to:  Patient  DME Arranged:    DME Agency:     HH Arranged:    [place] Agency:     Status of Service:  In process, will continue to follow  If discussed at Long Length of Stay Meetings, dates discussed:    Additional Comments:  Jolly Mango, RN 09/07/2017, 4:04 PM

## 2017-09-08 MED ORDER — LACTULOSE 10 GM/15ML PO SOLN
10.0000 g | Freq: Two times a day (BID) | ORAL | Status: DC | PRN
  Administered 2017-09-08: 10 g via ORAL
  Filled 2017-09-08: qty 30

## 2017-09-08 NOTE — Progress Notes (Signed)
   Subjective: 2 Days Post-Op Procedure(s) (LRB): COMPUTER ASSISTED TOTAL KNEE ARTHROPLASTY (Left) Patient reports pain as mild.   Patient is well, and has had no acute complaints or problems Did well with physical therapy yesterday Plan is to go Home after hospital stay. no nausea and no vomiting Patient denies any chest pains or shortness of breath. Objective: Vital signs in last 24 hours: Temp:  [97.4 F (36.3 C)-98.7 F (37.1 C)] 98.7 F (37.1 C) (11/06 2016) Pulse Rate:  [51-91] 68 (11/06 2016) Resp:  [16-20] 16 (11/06 2016) BP: (94-120)/(46-65) 120/60 (11/06 2016) SpO2:  [88 %-98 %] 95 % (11/06 2016) well approximated incision Heels are non tender and elevated off the bed using rolled towels Intake/Output from previous day: 11/06 0701 - 11/07 0700 In: 240 [P.O.:240] Out: 520 [Urine:300; Drains:220] Intake/Output this shift: Total I/O In: -  Out: 90 [Drains:90]  No results for input(s): HGB in the last 72 hours. No results for input(s): WBC, RBC, HCT, PLT in the last 72 hours. No results for input(s): NA, K, CL, CO2, BUN, CREATININE, GLUCOSE, CALCIUM in the last 72 hours. No results for input(s): LABPT, INR in the last 72 hours.  EXAM General - Patient is Alert, Appropriate and Oriented Extremity - Neurologically intact Neurovascular intact Sensation intact distally Intact pulses distally Dorsiflexion/Plantar flexion intact No cellulitis present Compartment soft Dressing - scant drainage Motor Function - intact, moving foot and toes well on exam.    Past Medical History:  Diagnosis Date  . Arthritis   . Cancer ([place])    right arm - skin melanoma  . Depression    with loss of son  . Diabetes mellitus without complication (H[facility])    prediabetic  . Hyperlipidemia 2014  . Hypertension   . Pain    right knee  . Pre-diabetes   . PVD (peripheral vascular disease) ([facility])     Assessment/Plan: 2 Days Post-Op Procedure(s) (LRB): COMPUTER ASSISTED TOTAL KNEE  ARTHROPLASTY (Left) Active Problems:   S/P total knee arthroplasty  Estimated body mass index is 37.44 kg/m as calculated from the following:   Height as of this encounter: 5\' 5"  (1.651 m).   Weight as of this encounter: 102.1 kg (225 lb). Up with therapy Discharge home with home health  Labs: Were reviewed and acceptable DVT Prophylaxis - Lovenox, Foot Pumps and TED hose Weight-Bearing as tolerated to left leg Hemovac was discontinued on today's visit Please wash the operative leg and apply TED stockings after changing the dressing. Please give the patient 2 extra honeycomb dressings to take home.  Jillyn Ledger. [place] 09/08/2017, 6:56 AM

## 2017-09-08 NOTE — Care Management Note (Signed)
Case Management Note  Patient Details  Name: Kendra Downs MRN: [id number] Date of Birth: [birth year]  Subjective/Objective:  Discharging today                  Action/Plan: Cost of Lovenox is $0. Patient updated.   Expected Discharge Date:  09/08/17               Expected Discharge Plan:  OP Rehab  In-House Referral:     Discharge planning Services  CM Consult  Post Acute Care Choice:    Choice offered to:  Patient  DME Arranged:    DME Agency:     HH Arranged:    [place] Agency:     Status of Service:  Completed, signed off  If discussed at [employer] of Stay Meetings, dates discussed:    Additional Comments:  Jolly Mango, RN 09/08/2017, 8:44 AM

## 2017-09-08 NOTE — Progress Notes (Signed)
Discharge summary reviewed with verbal understanding. Changed dressing and provided one for home use. Applied TED to operative leg. 2 narcotic Rxs given upon discharge. Belongings packed. Escorted to personal vehicle via wc by ortho staff.

## 2017-09-08 NOTE — Progress Notes (Signed)
OT Cancellation Note  Patient Details Name: Kendra Downs MRN: [id number] DOB: 04-Aug-1946   Cancelled Treatment:    Reason Eval/Treat Not Completed: Patient declined, no reason specified. Spoke with pt this am working with PT. Pt reports confidence with home set up and equipment at home. Verbalizes confidence with ADL routine and assist from family as needed. Pt politely declines OT evaluation at this time. Will sign off. If additional needs arise, please re-consult.   Jeni Salles, MPH, MS, OTR/L ascom [phone] 09/08/17, 9:43 AM

## 2017-09-08 NOTE — Progress Notes (Signed)
Physical Therapy Treatment Patient Details Name: Kendra Downs MRN: [id number] DOB: 22-Sep-1946 Today's Date: 09/08/2017    History of Present Illness admitted for acute hospitalization status post L TKR (09/06/17), WBAT.    PT Comments    Excellent progress towards all therapy goals this date, tolerating gait around nursing station (min cuing for mechanics, slow but steady cadence) and completing stair training without difficulty. No greater than sup level of assist required throughout.  Good L knee control and stability noted throughout session; ROM progressing as expected. Patient comfortable with functional status and upcoming discharge home.  Anticipate consistent progression towards PLOF with continued therapy in outpatient setting.    Follow Up Recommendations  Outpatient PT     Equipment Recommendations       Recommendations for Other Services       Precautions / Restrictions Precautions Precautions: Fall Restrictions Weight Bearing Restrictions: Yes LLE Weight Bearing: Weight bearing as tolerated    Mobility  Bed Mobility Overal bed mobility: Modified Independent                Transfers Overall transfer level: Needs assistance Equipment used: Rolling walker (2 wheeled) Transfers: Sit to/from Stand Sit to Stand: Supervision         General transfer comment: still requiring intermittent cuing for hand placement; maintains broad BOS, decreased active use of L LE.  Educated in goal of improved knee flexion, improved active use of LLE with movement transitions as pain improves.  Patient voiced understanding.  Ambulation/Gait Ambulation/Gait assistance: Supervision Ambulation Distance (Feet): 220 Feet Assistive device: Rolling walker (2 wheeled)   Gait velocity: 10' walk time, 8 seconds   General Gait Details: good step through gait pattern, min cuing for increased stance time L LE and decreased L pelvic retraction (improved postural extension,  improved L hip end-range extension) in L stance phase   Stairs Stairs: Yes   Stair Management: With walker Number of Stairs: 1(x2) General stair comments: min cuing for stepping sequence and technique  Wheelchair Mobility    Modified Rankin (Stroke Patients Only)       Balance Overall balance assessment: Modified Independent Sitting-balance support: No upper extremity supported;Feet supported Sitting balance-Leahy Scale: Normal     Standing balance support: Bilateral upper extremity supported Standing balance-Leahy Scale: Fair                              Cognition Arousal/Alertness: Awake/alert Behavior During Therapy: WFL for tasks assessed/performed Overall Cognitive Status: Within Functional Limits for tasks assessed                                 General Comments: mild STM deficits apparent      Exercises Total Joint Exercises Goniometric ROM: L knee: 3-90 degrees, act assist.  Slight increase in edema, generalized soreness this date (limited end-range ROM) Other Exercises Other Exercises: Standing LE therex, 1x12, AROM with RW, cga/close sup: heel raises, mini squats (with emphasis on symmetrical LE WBing), marching, hip abduct/adduct/flex/ext.  Good control and stability in L SLS; min cuing for postural extensino and full activation of L hip musculature in stance Other Exercises: Toilet transfer, ambulatory with RW, sup/mod indep; sit/satnd from standard toilet, sup/mod indep with grab bar.  Broad BOS, excessive weight shift to R LE. Other Exercises: Reviewed car transfer technique and handout with supine LE therex for use as HEP; patient  voiced understanding.    General Comments        Pertinent Vitals/Pain Pain Assessment: 0-10 Pain Score: 5  Pain Location: L knee Pain Descriptors / Indicators: Aching;Grimacing;Guarding Pain Intervention(s): Limited activity within patient's tolerance;Monitored during  session;Repositioned;Patient requesting pain meds-RN notified;RN gave pain meds during session    Home Living                      Prior Function            PT Goals (current goals can now be found in the care plan section) Acute Rehab PT Goals Patient Stated Goal: to return home with daughter PT Goal Formulation: With patient/family Time For Goal Achievement: 09/21/17 Potential to Achieve Goals: Good Progress towards PT goals: Progressing toward goals    Frequency    BID      PT Plan Current plan remains appropriate    Co-evaluation              AM-PAC PT "6 Clicks" Daily Activity  Outcome Measure  Difficulty turning over in bed (including adjusting bedclothes, sheets and blankets)?: None Difficulty moving from lying on back to sitting on the side of the bed? : None Difficulty sitting down on and standing up from a chair with arms (e.g., wheelchair, bedside commode, etc,.)?: A Little Help needed moving to and from a bed to chair (including a wheelchair)?: None Help needed walking in hospital room?: None Help needed climbing 3-5 steps with a railing? : A Little 6 Click Score: 22    End of Session Equipment Utilized During Treatment: Gait belt Activity Tolerance: Patient tolerated treatment well Patient left: with call bell/phone within reach;in chair;with chair alarm set;with family/visitor present Nurse Communication: Mobility status PT Visit Diagnosis: Difficulty in walking, not elsewhere classified (R26.2);Muscle weakness (generalized) (M62.81);Pain Pain - Right/Left: Left Pain - part of body: Knee     Time: 0926-1008 PT Time Calculation (min) (ACUTE ONLY): 42 min  Charges:  $Gait Training: 8-22 mins $Therapeutic Exercise: 8-22 mins $Therapeutic Activity: 8-22 mins                    G Codes:      Aleph Nickson H. Owens Shark, PT, DPT, NCS 09/08/17, 1:05 PM [phone]

## 2017-09-09 DIAGNOSIS — Z96652 Presence of left artificial knee joint: Secondary | ICD-10-CM | POA: Diagnosis not present

## 2017-09-13 ENCOUNTER — Other Ambulatory Visit: Payer: Self-pay

## 2017-09-13 DIAGNOSIS — E119 Type 2 diabetes mellitus without complications: Secondary | ICD-10-CM

## 2017-09-13 DIAGNOSIS — Z96652 Presence of left artificial knee joint: Secondary | ICD-10-CM | POA: Diagnosis not present

## 2017-09-13 NOTE — Patient Outreach (Signed)
[facility]) Care Management  09/13/2017  Kendra Downs 11/26/1945 [id number]   Spoke to patient on the phone- she is feeling well- her sister stayed with her last week and her grand daughter is staying with her now.  She is taking pain medication on a regular basis.  She tells me she is feeling well and is sleeping better than she did the last time she had surgery.   I have reviewed her medications with her.  She has not restarted some of her medications. I have cautioned [name] to be careful when she is up walking as her great grandson is also at her home.   I will follow up with her in a few weeks.   Gentry Fitz, RN, BA, [place], [place] Direct Dial:  [phone]  Fax:  [phone] E-mail: [email or web address] [address], [place], [place]  [address]

## 2017-09-15 ENCOUNTER — Ambulatory Visit: Payer: Self-pay

## 2017-09-15 DIAGNOSIS — Z96652 Presence of left artificial knee joint: Secondary | ICD-10-CM | POA: Diagnosis not present

## 2017-09-17 DIAGNOSIS — Z96652 Presence of left artificial knee joint: Secondary | ICD-10-CM | POA: Diagnosis not present

## 2017-09-20 DIAGNOSIS — Z96652 Presence of left artificial knee joint: Secondary | ICD-10-CM | POA: Diagnosis not present

## 2017-09-22 DIAGNOSIS — Z96652 Presence of left artificial knee joint: Secondary | ICD-10-CM | POA: Diagnosis not present

## 2017-09-27 ENCOUNTER — Other Ambulatory Visit: Payer: Self-pay

## 2017-09-27 NOTE — Patient Outreach (Signed)
[facility]) Care Management  09/27/2017  Kendra Downs April 02, 1946 [id number]   Spoke to [name] by phone today. She feels well and started her physical therapy last week. She is going 2 times a week. She is no longer taking her post operative pain medication but she is using Aleve as needed.  She is already driving without any difficulty. She is able to walk around her house with a cane.  She anticipates she will return to work in the new year. [name] will transition to [employer] in 2019.    Gentry Fitz, RN, BA, [place], [place] Direct Dial:  [phone]  Fax:  [phone] E-mail: [email or web address] [address] [place], [place]  [address]

## 2017-09-29 DIAGNOSIS — Z96652 Presence of left artificial knee joint: Secondary | ICD-10-CM | POA: Diagnosis not present

## 2017-10-01 DIAGNOSIS — Z96652 Presence of left artificial knee joint: Secondary | ICD-10-CM | POA: Diagnosis not present

## 2017-10-04 DIAGNOSIS — Z96652 Presence of left artificial knee joint: Secondary | ICD-10-CM | POA: Diagnosis not present

## 2017-10-05 ENCOUNTER — Inpatient Hospital Stay: Admission: RE | Admit: 2017-10-05 | Payer: Self-pay | Source: Ambulatory Visit

## 2017-10-07 DIAGNOSIS — Z96652 Presence of left artificial knee joint: Secondary | ICD-10-CM | POA: Diagnosis not present

## 2017-10-14 DIAGNOSIS — M25562 Pain in left knee: Secondary | ICD-10-CM | POA: Diagnosis not present

## 2017-10-14 DIAGNOSIS — Z96652 Presence of left artificial knee joint: Secondary | ICD-10-CM | POA: Diagnosis not present

## 2017-10-19 DIAGNOSIS — Z96651 Presence of right artificial knee joint: Secondary | ICD-10-CM | POA: Diagnosis not present

## 2017-10-19 DIAGNOSIS — Z96652 Presence of left artificial knee joint: Secondary | ICD-10-CM | POA: Diagnosis not present

## 2017-10-22 DIAGNOSIS — Z85820 Personal history of malignant melanoma of skin: Secondary | ICD-10-CM | POA: Diagnosis not present

## 2017-10-22 DIAGNOSIS — L821 Other seborrheic keratosis: Secondary | ICD-10-CM | POA: Diagnosis not present

## 2017-10-22 DIAGNOSIS — Z85828 Personal history of other malignant neoplasm of skin: Secondary | ICD-10-CM | POA: Diagnosis not present

## 2017-10-22 DIAGNOSIS — Z08 Encounter for follow-up examination after completed treatment for malignant neoplasm: Secondary | ICD-10-CM | POA: Diagnosis not present

## 2017-12-24 DIAGNOSIS — Z1159 Encounter for screening for other viral diseases: Secondary | ICD-10-CM | POA: Diagnosis not present

## 2017-12-24 DIAGNOSIS — E119 Type 2 diabetes mellitus without complications: Secondary | ICD-10-CM | POA: Diagnosis not present

## 2017-12-30 DIAGNOSIS — E119 Type 2 diabetes mellitus without complications: Secondary | ICD-10-CM | POA: Diagnosis not present

## 2017-12-30 DIAGNOSIS — I10 Essential (primary) hypertension: Secondary | ICD-10-CM | POA: Diagnosis not present

## 2018-01-07 ENCOUNTER — Other Ambulatory Visit: Payer: Self-pay | Admitting: Family Medicine

## 2018-01-07 DIAGNOSIS — M19011 Primary osteoarthritis, right shoulder: Secondary | ICD-10-CM | POA: Diagnosis not present

## 2018-01-07 DIAGNOSIS — M7551 Bursitis of right shoulder: Secondary | ICD-10-CM | POA: Diagnosis not present

## 2018-01-07 DIAGNOSIS — Z1231 Encounter for screening mammogram for malignant neoplasm of breast: Secondary | ICD-10-CM

## 2018-01-25 ENCOUNTER — Ambulatory Visit
Admission: RE | Admit: 2018-01-25 | Discharge: 2018-01-25 | Disposition: A | Discharge status: Home / Self Care | Payer: 59 | Source: Ambulatory Visit | Attending: Family Medicine | Admitting: Family Medicine

## 2018-01-25 DIAGNOSIS — Z1231 Encounter for screening mammogram for malignant neoplasm of breast: Secondary | ICD-10-CM | POA: Diagnosis not present

## 2018-06-27 DIAGNOSIS — E119 Type 2 diabetes mellitus without complications: Secondary | ICD-10-CM | POA: Diagnosis not present

## 2018-06-27 DIAGNOSIS — I10 Essential (primary) hypertension: Secondary | ICD-10-CM | POA: Diagnosis not present

## 2018-07-06 DIAGNOSIS — E119 Type 2 diabetes mellitus without complications: Secondary | ICD-10-CM | POA: Diagnosis not present

## 2018-07-06 DIAGNOSIS — I10 Essential (primary) hypertension: Secondary | ICD-10-CM | POA: Diagnosis not present

## 2018-07-06 DIAGNOSIS — Z0000 Encounter for general adult medical examination without abnormal findings: Secondary | ICD-10-CM | POA: Diagnosis not present

## 2018-07-06 DIAGNOSIS — E785 Hyperlipidemia, unspecified: Secondary | ICD-10-CM | POA: Diagnosis not present

## 2018-09-06 DIAGNOSIS — Z96653 Presence of artificial knee joint, bilateral: Secondary | ICD-10-CM | POA: Diagnosis not present

## 2018-09-06 DIAGNOSIS — M170 Bilateral primary osteoarthritis of knee: Secondary | ICD-10-CM | POA: Diagnosis not present

## 2018-10-01 IMAGING — DX DG KNEE 1-2V PORT*R*
2 series · 2 of 2 positions shown · non-contrast
Comparison: None in PACs

CLINICAL DATA: Post- operative portable radiographs following right
total knee joint replacement.

EXAM:
PORTABLE RIGHT KNEE - 1-2 VIEW

[knee ap]
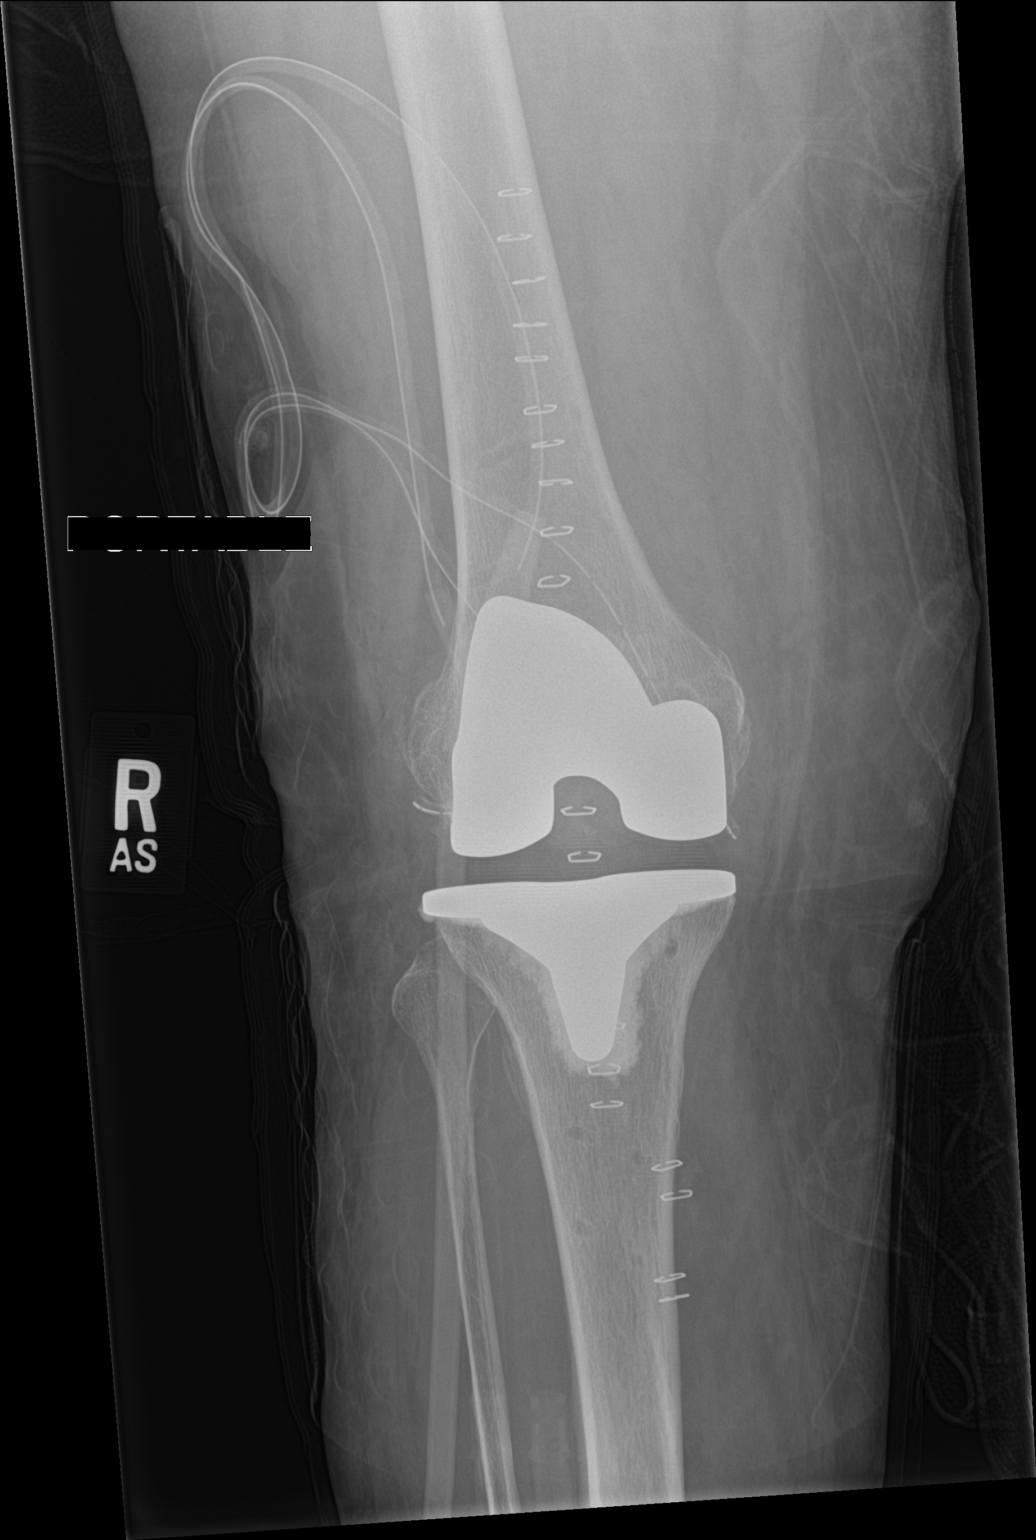

[knee lat]
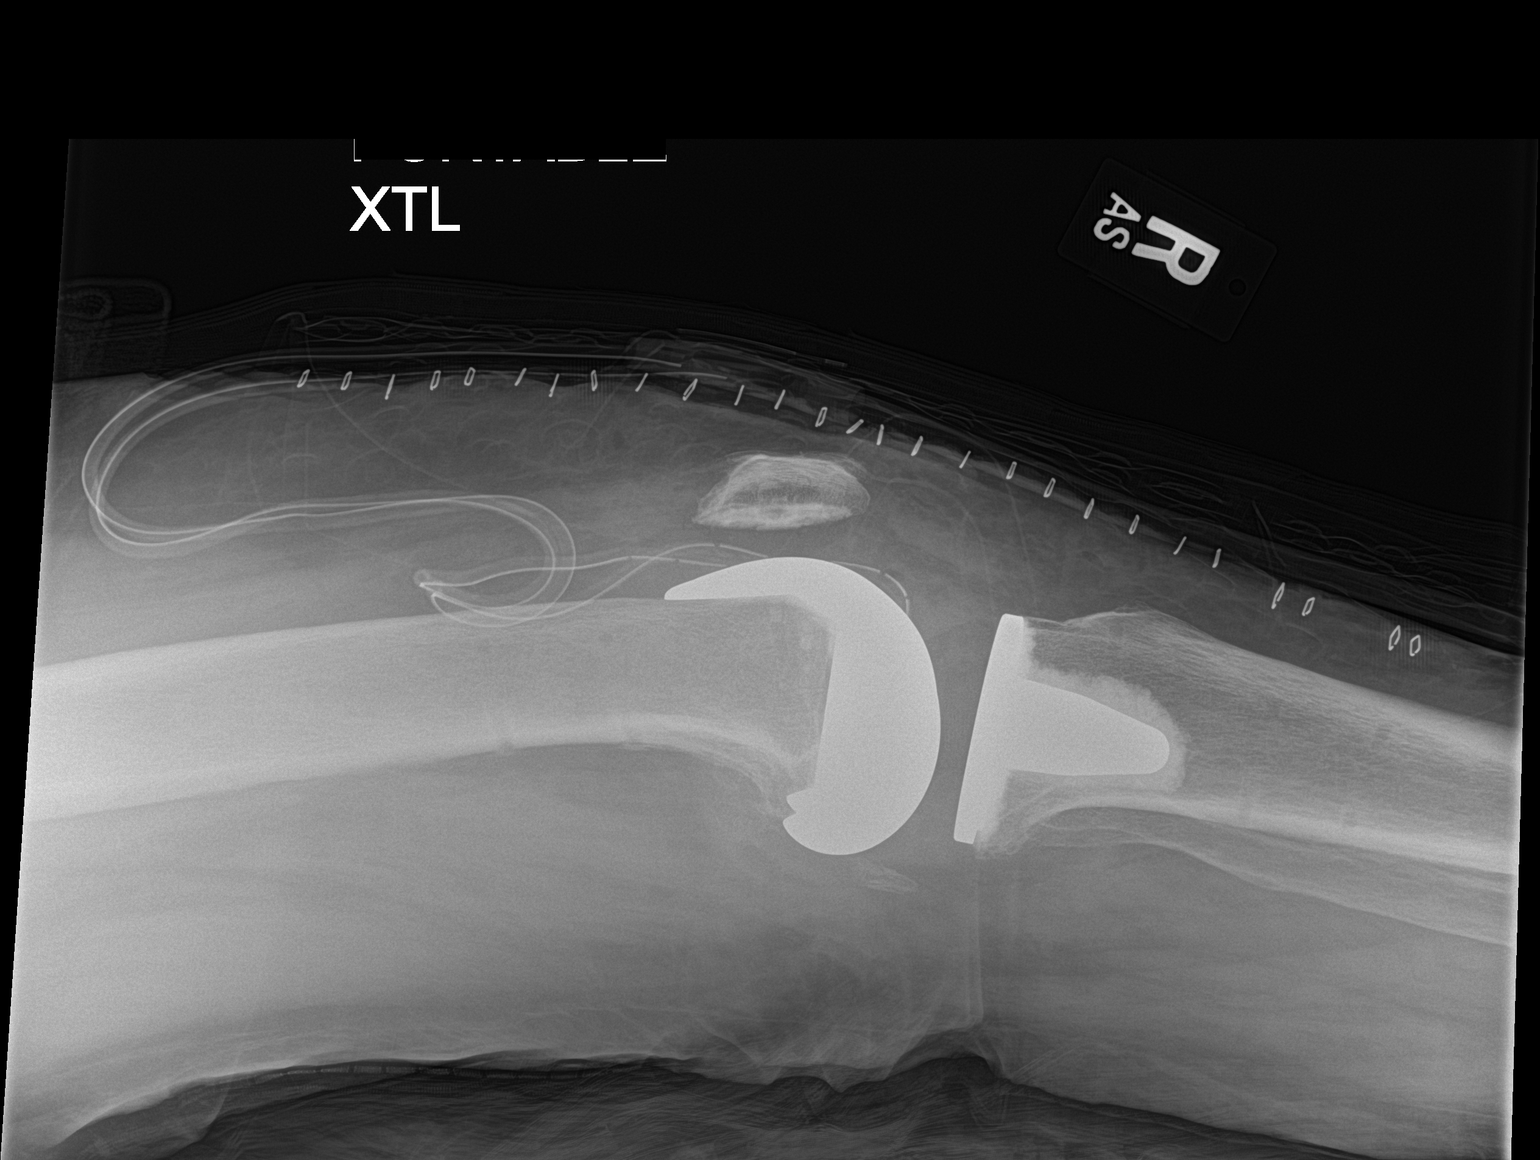

[2 of 2 positions shown; findings below may reference images not displayed]

FINDINGS: The patient has undergone right total knee arthroplasty.
Radiographic positioning of the prosthetic components is good. The
interface with the native bone appears normal. There are screw
tracks in the distal femoral metadiaphysis and proximal tibial
metadiaphysis. Surgical drain lines are present.
IMPRESSION: No immediate complication following right total knee joint
replacement.

## 2018-10-07 ENCOUNTER — Encounter: Payer: Self-pay | Admitting: Physician Assistant

## 2018-10-07 ENCOUNTER — Ambulatory Visit: Payer: Self-pay | Admitting: Physician Assistant

## 2018-10-07 VITALS — BP 154/80 | HR 72 | Temp 98.2°F | Wt 224.0 lb

## 2018-10-07 DIAGNOSIS — J069 Acute upper respiratory infection, unspecified: Secondary | ICD-10-CM

## 2018-10-07 MED ORDER — PSEUDOEPH-BROMPHEN-DM 30-2-10 MG/5ML PO SYRP: 5.0000 mL | ORAL_SOLUTION | Freq: Four times a day (QID) | ORAL | 0 refills | Status: DC | PRN

## 2018-10-07 MED ORDER — BENZONATATE 100 MG PO CAPS: 100.0000 mg | ORAL_CAPSULE | Freq: Two times a day (BID) | ORAL | 0 refills | Status: DC | PRN

## 2018-10-07 MED ORDER — FLUTICASONE PROPIONATE 50 MCG/ACT NA SUSP
2.0000 | Freq: Every day | NASAL | 6 refills | Status: DC
Start: 2018-10-07 — End: 2022-04-08

## 2018-10-07 NOTE — Progress Notes (Signed)
Patient ID: EZELL MELIKIAN DOB: 02/01/1946 AGE: 72 y.o. MRN: [id number]   PCP: Maryland Pink, MD   Chief Complaint:  Chief Complaint  Patient presents with  . Save-drainage/hoarseness/cough x 3d     Subjective:    HPI:  PAISLY FINGERHUT is a 72 y.o. female presents for evaluation  Chief Complaint  Patient presents with  . Save-drainage/hoarseness/cough x 31d    72 year old female presents to [facility] with one week history of URI symptoms. Began with rhinorrhea, nasal congestion, and postnasal drip. Yesterday lost her voice. Today voice has improved; hoarse, in and out. Today developed cough. Coarse sounding. Dry. Has taken OTC Zyrtec with minimal relief; caused overly dry sensation in nose and mouth. Patient denies fever, chills, malaise, headache, ear pain, sinus pain/congestion, sore throat, chest pain, SOB, wheezing. Patient is a former cigarette smoker. Quit 30 years ago. Denies asthma, COPD, or emphysema diagnosis.   Patient presents today due to concern symptoms will turn into bronchitis over the weekend. Patient states she feels well; does not feel ill/bad.  A complete, at least 10 system review of symptoms was performed, pertinent positives and negatives as mentioned in HPI, otherwise negative.  The following portions of the patient's history were reviewed and updated as appropriate: allergies, current medications and past medical history.  Patient Active Problem List   Diagnosis Date Noted  . S/P total knee arthroplasty 11/09/2016  . Diabetes type 2, controlled ([place]) 08/12/2015  . Arthritis of knee, degenerative 05/18/2014    No Known Allergies  Current Outpatient Medications on File Prior to Visit  Medication Sig Dispense Refill  . glucose blood (FREESTYLE LITE) test strip USE 1 STRIP VIA METER ONCE A DAY AS DIRECTED    . hydrochlorothiazide (MICROZIDE) 12.5 MG capsule Take 12.5 mg by mouth daily.    Marland Kitchen MICROLET LANCETS MISC Microlet Lancet    .  NUTRITIONAL SUPPLEMENT LIQD Take 1 scoop by mouth daily. Isagenix shake powder multivitamin    . quinapril-hydrochlorothiazide (ACCURETIC) 20-12.5 MG tablet Take 1 tablet by mouth daily.     . rosuvastatin (CRESTOR) 5 MG tablet Take by mouth.    . traMADol (ULTRAM) 50 MG tablet Take 1-2 tablets (50-100 mg total) every 4 (four) hours as needed by mouth for moderate pain. 60 tablet 0  . Vitamin D, Ergocalciferol, (DRISDOL) 50000 units CAPS capsule Take 50,000 Units by mouth every 7 (seven) days. Either Wednesday or Thursday     No current facility-administered medications on file prior to visit.        Objective:   Vitals:   10/07/18 1420  BP: (!) 154/80  Pulse: 72  Temp: 98.2 F (36.8 C)  SpO2: 98%     Wt Readings from Last 3 Encounters:  10/07/18 224 lb (101.6 kg)  09/07/17 225 lb (102.1 kg)  08/23/17 220 lb 4.8 oz (99.9 kg)    Physical Exam:   General Appearance:  Smiling, friendly, cooperative, appears stated age. In no acute distress. Afebrile.  Head:  Normocephalic, without obvious abnormality, atraumatic  Eyes:  PERRL, conjunctiva/corneas clear, EOM's intact, fundi benign, both eyes  Ears:  Normal TM's and external ear canals, both ears  Nose: Nares normal, septum midline. Nasal mucosa reveals bilateral edema with thick yellow nasal discharge. Patient unable to breathe through nose. No sinus tenderness to palpation/percussion.  Throat: Lips, mucosa, and tongue normal; teeth and gums normal. Throat reveals no erythema. Tonsils with no enlargement or exudate.  Neck: Supple, symmetrical, trachea midline, no lymphadenopathy  Lungs:   Clear to auscultation bilaterally, respirations unlabored. No wheezing, rales, rhonchi, or crackles. Provider requested patient cough; coarse and bronchitic sounding.  Heart:  Regular rate and rhythm, S1 and S2 normal, no murmur, rub, or gallop  Extremities: Extremities normal, atraumatic, no cyanosis or edema  Pulses: 2+ and symmetric  Skin:  Skin color, texture, turgor normal, no rashes or lesions  Lymph nodes: Cervical, supraclavicular, and axillary nodes normal  Neurologic: Normal    Assessment & Plan:    Exam findings, diagnosis etiology and medication use and indications reviewed with patient. Follow-Up and discharge instructions provided. No emergent/urgent issues found on exam.  Patient education was provided.   Patient verbalized understanding of information provided and agrees with plan of care (POC), all questions answered. The patient is advised to call or return to clinic if condition does not see an improvement in symptoms, or to seek the care of the closest emergency department if condition worsens with the below plan.    1. Upper respiratory tract infection, unspecified type  - fluticasone (FLONASE) 50 MCG/ACT nasal spray; Place 2 sprays into both nostrils daily.  Dispense: 16 g; Refill: 6 - benzonatate (TESSALON) 100 MG capsule; Take 1 capsule (100 mg total) by mouth 2 (two) times daily as needed for cough.  Dispense: 20 capsule; Refill: 0 - brompheniramine-pseudoephedrine-DM 30-2-10 MG/5ML syrup; Take 5 mLs by mouth 4 (four) times daily as needed.  Dispense: 120 mL; Refill: 0  Patient with one week history of viral URI symptoms. VSS. Patient appears well on examination. PND, nasal congestion, and rhinorrhea. Prescribed Flonase nasal spray. Patient with new onset of cough. Coarse/junky sounding. Prescribed Bromfed cough syrup and Tessalon Perles. Discussed increasing fluids, resting, and using humidifier in bedroom. Advised patient return to clinic in 4-5 days if not improving, sooner with worsening symptoms.   Darlin Priestly, MHS, PA-C Montey Hora, MHS, PA-C Advanced Practice Provider [facility]  [address], [facility], [place], [place] [address] (p):  [phone] [email or web address]

## 2018-10-07 NOTE — Patient Instructions (Signed)
Thank you for choosing [facility] for your health care needs.  You have been diagnosed with an upper respiratory infection (a cold).  Recommend you increase your fluids. Rest. May use humidifier in bedroom.  You have been prescribed a steroid nasal spray, Flonase. You have been prescribed a cough suppressant, Tessalon Perles. You have been prescribed a prescription combo cough syrup that will help with cough and congestion.  Hope you feel better soon!  Return to [facility] or follow-up with family physician in 4-5 days if symptoms not improving. Sooner with any worsening symptoms.  Upper Respiratory Infection, Adult Most upper respiratory infections (URIs) are caused by a virus. A URI affects the nose, throat, and upper air passages. The most common type of URI is often called "the common cold." Follow these instructions at home:  Take medicines only as told by your doctor.  Gargle warm saltwater or take cough drops to comfort your throat as told by your doctor.  Use a warm mist humidifier or inhale steam from a shower to increase air moisture. This may make it easier to breathe.  Drink enough fluid to keep your pee (urine) clear or pale yellow.  Eat soups and other clear broths.  Have a healthy diet.  Rest as needed.  Go back to work when your fever is gone or your doctor says it is okay. ? You may need to stay home longer to avoid giving your URI to others. ? You can also wear a face mask and wash your hands often to prevent spread of the virus.  Use your inhaler more if you have asthma.  Do not use any tobacco products, including cigarettes, chewing tobacco, or electronic cigarettes. If you need help quitting, ask your doctor. Contact a doctor if:  You are getting worse, not better.  Your symptoms are not helped by medicine.  You have chills.  You are getting more short of breath.  You have brown or red mucus.  You have yellow or brown discharge from your  nose.  You have pain in your face, especially when you bend forward.  You have a fever.  You have puffy (swollen) neck glands.  You have pain while swallowing.  You have white areas in the back of your throat. Get help right away if:  You have very bad or constant: ? Headache. ? Ear pain. ? Pain in your forehead, behind your eyes, and over your cheekbones (sinus pain). ? Chest pain.  You have long-lasting (chronic) lung disease and any of the following: ? Wheezing. ? Long-lasting cough. ? Coughing up blood. ? A change in your usual mucus.  You have a stiff neck.  You have changes in your: ? Vision. ? Hearing. ? Thinking. ? Mood. This information is not intended to replace advice given to you by your health care provider. Make sure you discuss any questions you have with your health care provider. Document Released: 04/06/2008 Document Revised: 06/21/2016 Document Reviewed: 01/24/2014 Elsevier Interactive Patient Education  2018 [employer].

## 2018-10-10 ENCOUNTER — Telehealth: Payer: Self-pay | Admitting: Emergency Medicine

## 2018-10-10 NOTE — Telephone Encounter (Signed)
Let message follow up call from visit with [facility]

## 2018-10-20 DIAGNOSIS — D2272 Melanocytic nevi of left lower limb, including hip: Secondary | ICD-10-CM | POA: Diagnosis not present

## 2018-10-20 DIAGNOSIS — D2271 Melanocytic nevi of right lower limb, including hip: Secondary | ICD-10-CM | POA: Diagnosis not present

## 2018-10-20 DIAGNOSIS — Z08 Encounter for follow-up examination after completed treatment for malignant neoplasm: Secondary | ICD-10-CM | POA: Diagnosis not present

## 2018-10-20 DIAGNOSIS — D2262 Melanocytic nevi of left upper limb, including shoulder: Secondary | ICD-10-CM | POA: Diagnosis not present

## 2018-10-20 DIAGNOSIS — D2261 Melanocytic nevi of right upper limb, including shoulder: Secondary | ICD-10-CM | POA: Diagnosis not present

## 2018-10-20 DIAGNOSIS — Z85828 Personal history of other malignant neoplasm of skin: Secondary | ICD-10-CM | POA: Diagnosis not present

## 2018-10-20 DIAGNOSIS — Z85820 Personal history of malignant melanoma of skin: Secondary | ICD-10-CM | POA: Diagnosis not present

## 2018-11-09 ENCOUNTER — Other Ambulatory Visit
Admission: RE | Admit: 2018-11-09 | Discharge: 2018-11-09 | Disposition: A | Discharge status: Home / Self Care | Payer: 59 | Source: Ambulatory Visit | Attending: Family Medicine | Admitting: Family Medicine

## 2018-11-09 DIAGNOSIS — E119 Type 2 diabetes mellitus without complications: Secondary | ICD-10-CM | POA: Diagnosis not present

## 2018-11-09 LAB — COMPREHENSIVE METABOLIC PANEL
ALBUMIN: 4.3 g/dL (ref 3.5–5.0)
ALT: 11 U/L (ref 0–44)
ANION GAP: 9 (ref 5–15)
AST: 20 U/L (ref 15–41)
Alkaline Phosphatase: 55 U/L (ref 38–126)
BILIRUBIN TOTAL: 1.5 mg/dL — AB (ref 0.3–1.2)
BUN: 16 mg/dL (ref 8–23)
CHLORIDE: 102 mmol/L (ref 98–111)
CO2: 27 mmol/L (ref 22–32)
Calcium: 9.2 mg/dL (ref 8.9–10.3)
Creatinine, Ser: 0.62 mg/dL (ref 0.44–1.00)
GFR calc Af Amer: >60 mL/min (ref >60)
GLUCOSE: 116 mg/dL — AB (ref 70–99)
POTASSIUM: 3.9 mmol/L (ref 3.5–5.1)
Sodium: 138 mmol/L (ref 135–145)
TOTAL PROTEIN: 7.4 g/dL (ref 6.5–8.1)

## 2018-11-09 LAB — HEMOGLOBIN A1C
HEMOGLOBIN A1C: 6.7 % — AB (ref 4.8–5.6)
Mean Plasma Glucose: 145.59 mg/dL

## 2018-11-15 DIAGNOSIS — E119 Type 2 diabetes mellitus without complications: Secondary | ICD-10-CM | POA: Diagnosis not present

## 2018-11-15 DIAGNOSIS — E785 Hyperlipidemia, unspecified: Secondary | ICD-10-CM | POA: Diagnosis not present

## 2018-11-15 DIAGNOSIS — I10 Essential (primary) hypertension: Secondary | ICD-10-CM | POA: Diagnosis not present

## 2018-11-19 IMAGING — MG MM DIGITAL SCREENING BILAT W/ TOMO W/ CAD
8 of 12 series · 8 of 28 positions shown · non-contrast
Comparison: Previous exam(s).

CLINICAL DATA: Screening.

EXAM:
2D DIGITAL SCREENING BILATERAL MAMMOGRAM WITH CAD AND ADJUNCT TOMO

[L CC synth-2D]
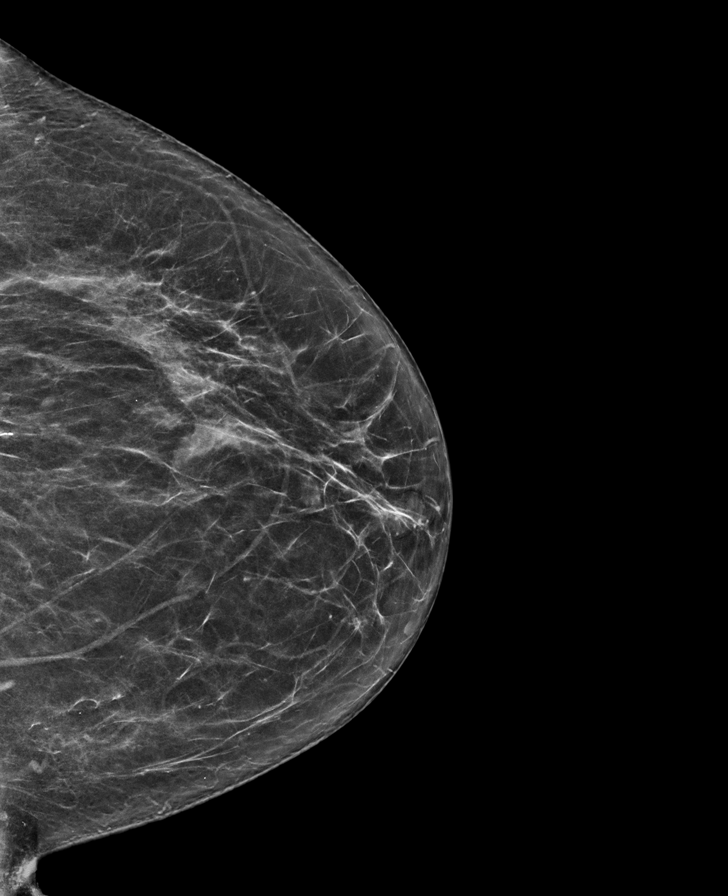

[R CC synth-2D]
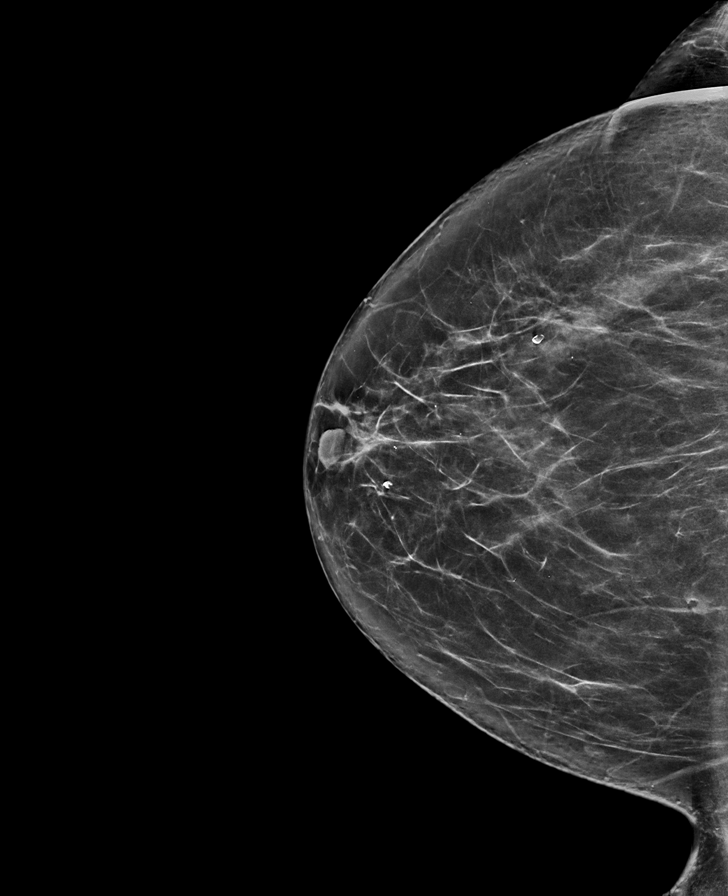

[R MLO synth-2D]
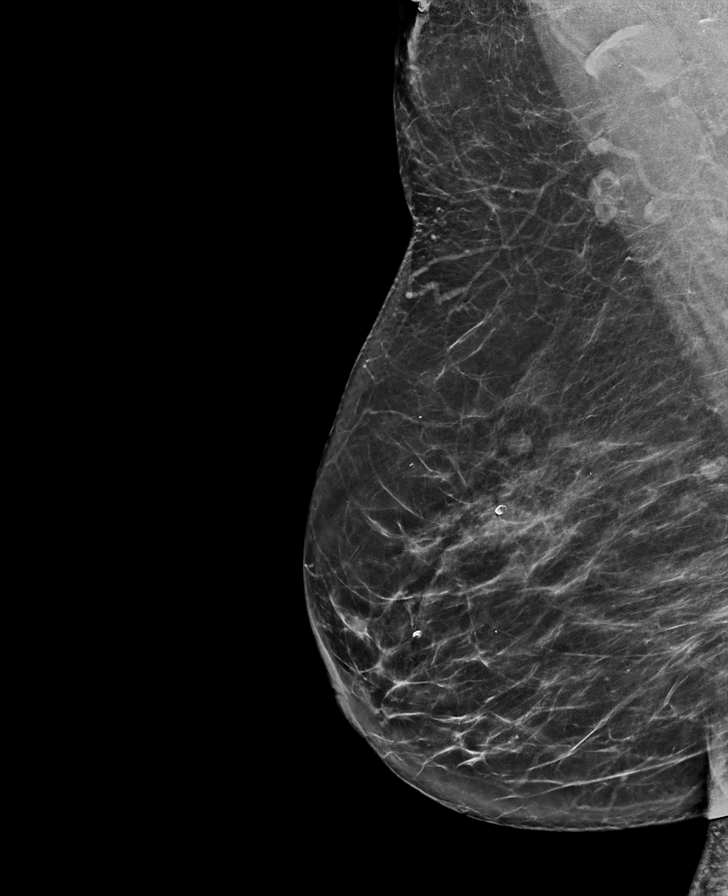

[R CC]
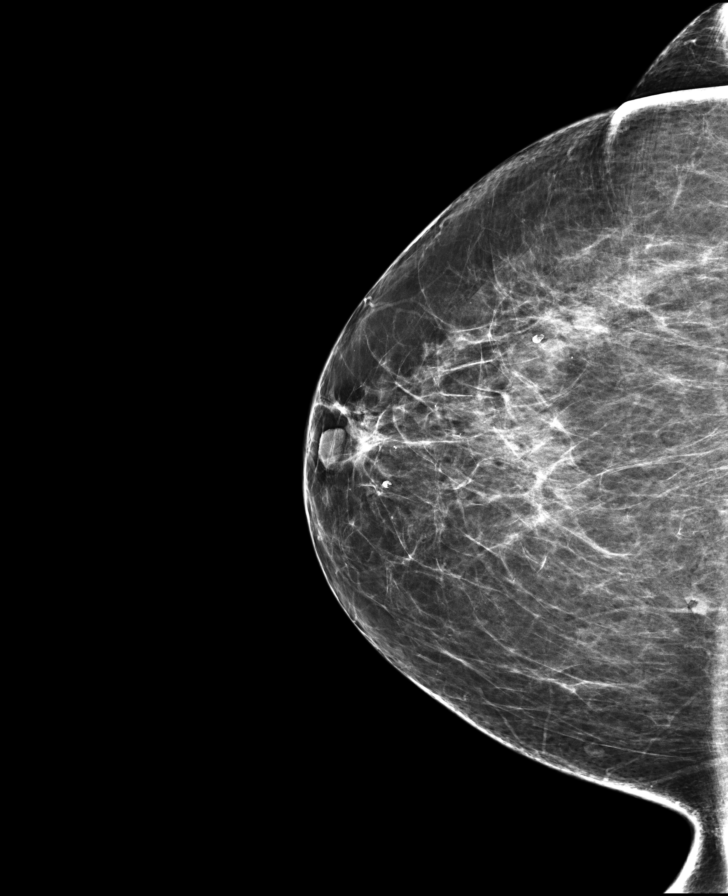

[L MLO synth-2D]
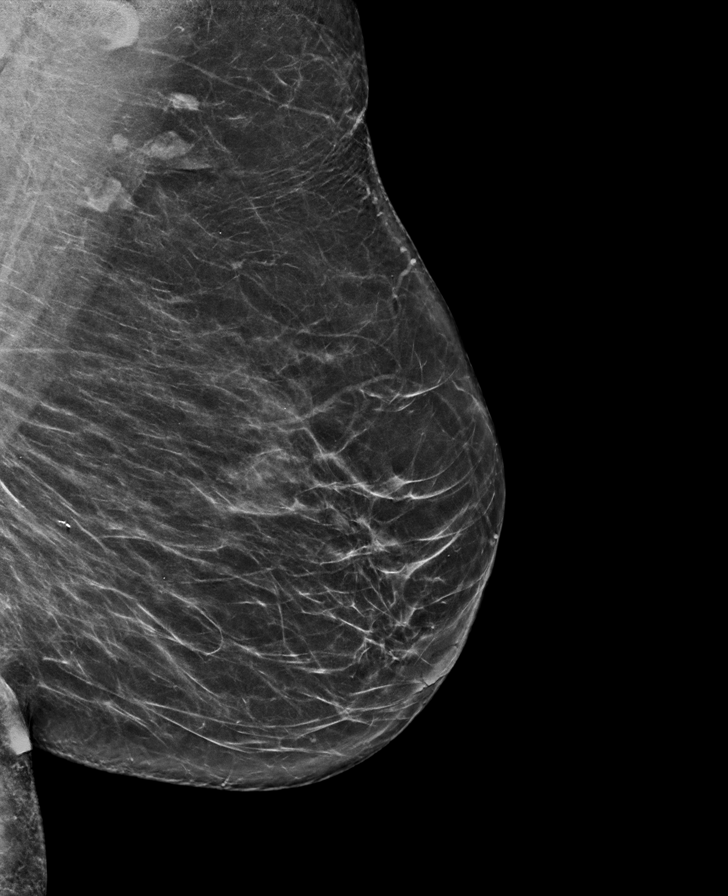

[L MLO]
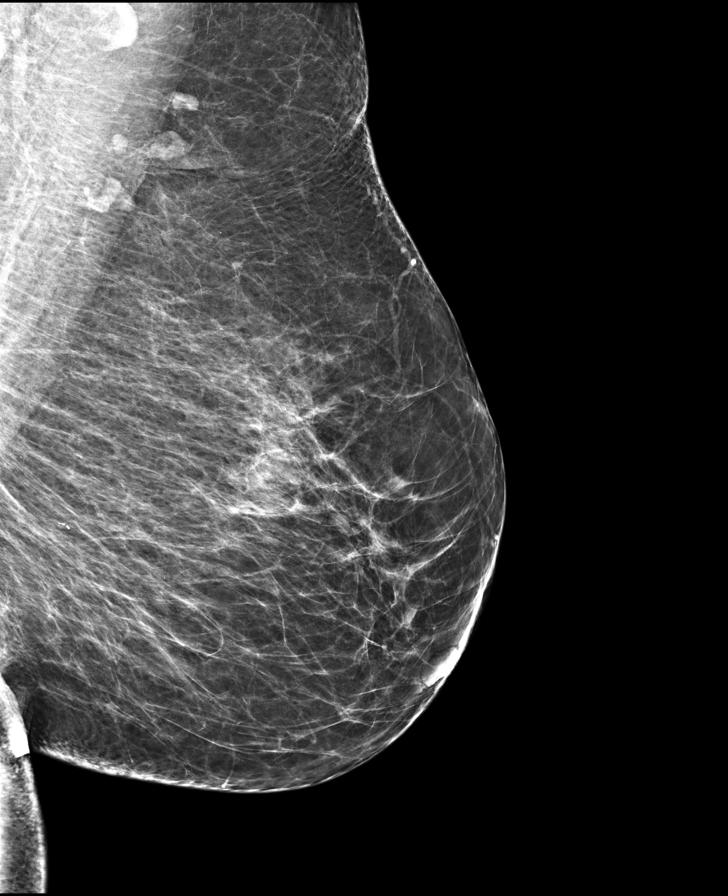

[L CC]
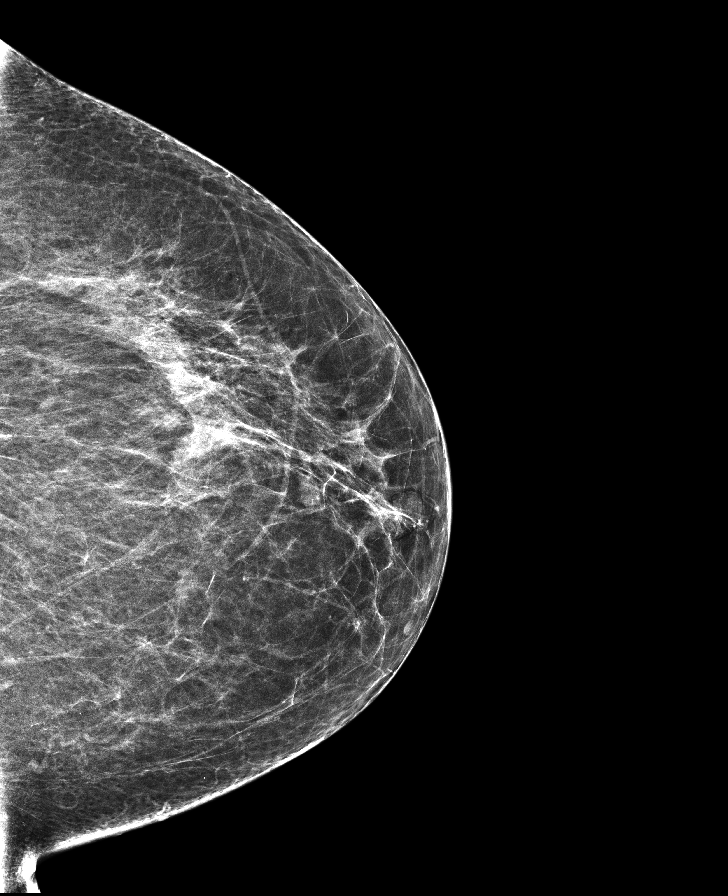

[R MLO]
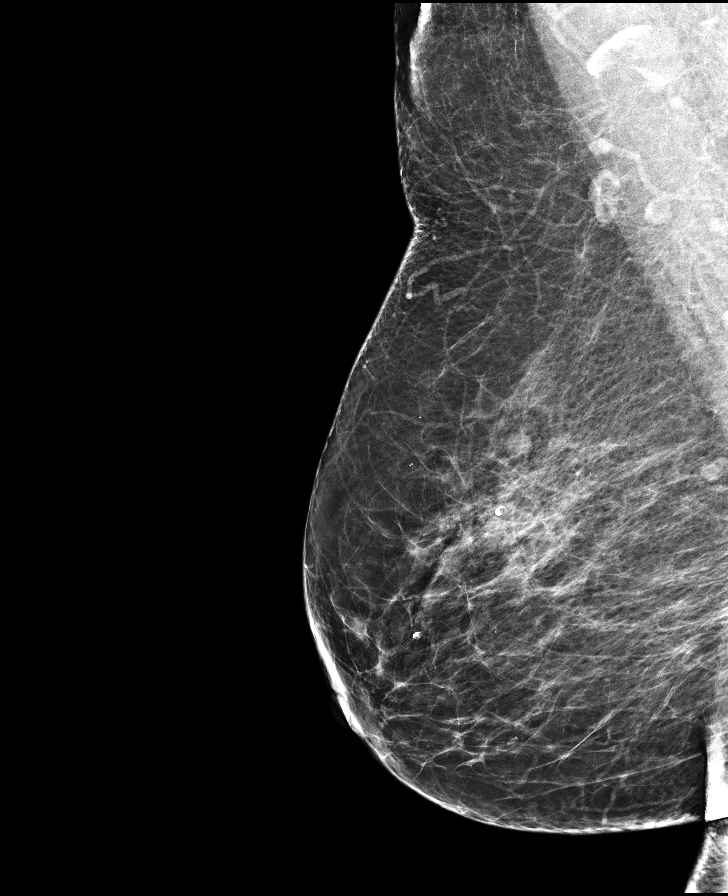

[8 of 28 positions shown; findings below may reference images not displayed]

ACR Breast Density Category b: There are scattered areas of
fibroglandular density.
FINDINGS: There are no findings suspicious for malignancy. Images were
processed with CAD.
IMPRESSION: No mammographic evidence of malignancy. A result letter of this
screening mammogram will be mailed directly to the patient.

RECOMMENDATION:
Screening mammogram in one year. (Code:JJ-K-A5R)

BI-RADS CATEGORY  2: Benign.

## 2018-11-21 ENCOUNTER — Encounter: Payer: Self-pay | Admitting: Physician Assistant

## 2018-11-21 ENCOUNTER — Ambulatory Visit: Payer: Self-pay | Admitting: Physician Assistant

## 2018-11-21 DIAGNOSIS — J209 Acute bronchitis, unspecified: Secondary | ICD-10-CM

## 2018-11-21 MED ORDER — BENZONATATE 100 MG PO CAPS: 100.0000 mg | ORAL_CAPSULE | Freq: Two times a day (BID) | ORAL | 0 refills | Status: DC | PRN

## 2018-11-21 MED ORDER — SALINE SPRAY 0.65 % NA SOLN: 1.0000 | NASAL | 0 refills | Status: DC | PRN

## 2018-11-21 MED ORDER — PSEUDOEPH-BROMPHEN-DM 30-2-10 MG/5ML PO SYRP: 5.0000 mL | ORAL_SOLUTION | Freq: Four times a day (QID) | ORAL | 0 refills | Status: DC | PRN

## 2018-11-21 NOTE — Progress Notes (Signed)
MRN: [id number] DOB: 05/14/1946  Subjective:   Kendra Downs is a 73 y.o. female presenting for chief complaint of cough and chest congestion (x1 day ) .  Reports 1 day history of cough and chest congestion. Noticed it when she got up this morning. Has some nasal congestion and post nasal drip as well. Will occasionally cough up phlegm, which is clear in color. Denies fever, chills, SOB, DOE, wheezing, chest pain, sinus pain, sore throat, voice change and myalgia, nausea, vomiting, abdominal pain and diarrhea. Has tried tessalon perles with relief. Was seen at [facility] in 10/2018 for a URI, which resolved. Was given Rx for tessalon perles and bromfed cough syrup at the time, which really helped. Did not notice increase in bp with use of cough syrup. Has not had any sick contact with exposure. Has history of seasonal allergies,taking flonase daily. Denies PMH of asthma or COPD.  Patient has had flu shot this season. Former smoker, quit 32 years ago. Denies any other aggravating or relieving factors, no other questions or concerns.  Review of Systems  Constitutional: Negative for diaphoresis and weight loss.  Respiratory: Negative for hemoptysis and shortness of breath.   Cardiovascular: Negative for palpitations.  Skin: Negative for rash.  Neurological: Negative for dizziness.    Jasmeet has a current medication list which includes the following prescription(s): benzonatate, brompheniramine-pseudoephedrine-dm, fluticasone, glucose blood, hydrochlorothiazide, microlet lancets, nutritional supplement, quinapril-hydrochlorothiazide, rosuvastatin, tramadol, and vitamin d (ergocalciferol). Also has No Known Allergies.  Sahalie  has a past medical history of Arthritis, Cancer ([place]), Depression, Diabetes mellitus without complication ([place]), Hyperlipidemia (2014), Hypertension, Pain, Pre-diabetes, and PVD (peripheral vascular disease) ([place]). Also  has a past surgical history that includes Laser ablation  (october 2013); Breast cyst aspiration (Right); Skin cancer excision; Knee Arthroplasty (Right, 11/09/2016); Joint replacement (Right, 11/09/2016); Fracture surgery; and Knee Arthroplasty (Left, 09/06/2017).   Objective:   Vitals: BP (!) 144/84   Pulse 77   Temp 98.1 F (36.7 C)   Resp 14   Wt 224 lb (101.6 kg)   SpO2 97%   BMI 37.28 kg/m   Physical Exam Vitals signs reviewed.  Constitutional:      General: She is not in acute distress.    Appearance: She is well-developed. She is not ill-appearing or toxic-appearing.  HENT:     Head: Normocephalic and atraumatic.     Right Ear: Tympanic membrane, ear canal and external ear normal.     Left Ear: Tympanic membrane, ear canal and external ear normal.     Nose: Congestion and rhinorrhea present.     Right Sinus: No maxillary sinus tenderness or frontal sinus tenderness.     Left Sinus: No maxillary sinus tenderness or frontal sinus tenderness.     Mouth/Throat:     Lips: Pink.     Mouth: Mucous membranes are moist.     Pharynx: Oropharynx is clear. Uvula midline. No posterior oropharyngeal erythema.     Tonsils: No tonsillar exudate or tonsillar abscesses. Swelling: 1+ on the right. 1+ on the left.  Eyes:     Conjunctiva/sclera: Conjunctivae normal.  Neck:     Musculoskeletal: Normal range of motion.  Cardiovascular:     Rate and Rhythm: Normal rate and regular rhythm.     Heart sounds: Normal heart sounds.  Pulmonary:     Effort: Pulmonary effort is normal.     Breath sounds: Rhonchi (few rhonchi noted, cleared with coughing) present. No decreased breath sounds, wheezing or rales.  Lymphadenopathy:  Head:     Right side of head: No submental, submandibular, tonsillar, preauricular, posterior auricular or occipital adenopathy.     Left side of head: No submental, submandibular, tonsillar, preauricular, posterior auricular or occipital adenopathy.     Cervical: No cervical adenopathy.     Upper Body:     Right upper body:  No supraclavicular adenopathy.     Left upper body: No supraclavicular adenopathy.  Skin:    General: Skin is warm and dry.  Neurological:     Mental Status: She is alert.     No results found for this or any previous visit (from the past 24 hour(s)).  Assessment and Plan :  1. Acute bronchitis, unspecified organism Pt is overall well appearing, NAD. VSS. She is afebrile. Few rhonchi auscultated on exam, cleared with coughing. No chest pain, wheezing, SOB, or DOE. This is consistent with viral bronchitis. Suggest symptomatic tx at this time. She has had success with tessalon perles and bromfed cough syrup in the past, refills provided. Encouraged to monitor bp while using cough syrup, if it elevates bp, d/c and use OTC coricidin cough syrup. Exam findings, diagnosis etiology, medication use, indications, side effects, risks, benefits, and alternatives of the medications and treatment plan prescribed today and reviewed with patient. Follow- Up and discharge instructions provided. No emergent/urgent issues found on exam. Patient education was provided. Patient verbalized understanding of information provided and agrees with plan of care (POC), all questions answered. No barriers to understanding were identified. Red flags discussed in detail. The patient is advised to return to clinic or f/u with family doctor if condition does not see an improvement in symptoms in 7-10 days,  or to seek the care of the closest urgent care or emergency department if condition worsens/develops new concerning sx with the above plan.  - benzonatate (TESSALON) 100 MG capsule; Take 1 capsule (100 mg total) by mouth 2 (two) times daily as needed for cough.  Dispense: 20 capsule; Refill: 0 - brompheniramine-pseudoephedrine-DM 30-2-10 MG/5ML syrup; Take 5 mLs by mouth 4 (four) times daily as needed.  Dispense: 120 mL; Refill: [place], [facility] 11/21/2018 2:28 PM

## 2018-11-21 NOTE — Patient Instructions (Addendum)
Acute Bronchitis, Adult   Thank you for choosing [facility] for your health care needs.   You have been diagnosed with bronchitis (chest cold).    Recommend increase fluids; water, Gatorade, or hot tea with water or lemon. May use humidifier or vaporizer in bedroom. Use several pillows to prop self up at night, may help with coughing and sleeping. Rest.   You have been prescribed a prescription cough syrup, bromfed. You can use this throughout the day to help with coughing. It can increase your blood pressure so if you notice a rise in blood pressure, you can discontinue and use over the counter coricidin cough syrup.   Use tessalon perles during the day as well.   I suggest continuing daily flonase. Start using nasal saline rinses at night time.    Hope you feel better soon.   Follow-up with family physician, Kendra Downs, or urgent care in 7-10 days if symptoms not improving. Sooner with any worsening symptoms.  Acute bronchitis is when air tubes (bronchi) in the lungs suddenly get swollen. The condition can make it hard to breathe. It can also cause these symptoms:  A cough.  Coughing up clear, yellow, or green mucus.  Wheezing.  Chest congestion.  Shortness of breath.  A fever.  Body aches.  Chills.  A sore throat. Follow these instructions at home:  Medicines  Take over-the-counter and prescription medicines only as told by your doctor.  If you were prescribed an antibiotic medicine, take it as told by your doctor. Do not stop taking the antibiotic even if you start to feel better. General instructions  Rest.  Drink enough fluids to keep your pee (urine) pale yellow.  Avoid smoking and secondhand smoke. If you smoke and you need help quitting, ask your doctor. Quitting will help your lungs heal faster.  Use an inhaler, cool mist vaporizer, or humidifier as told by your doctor.  Keep all follow-up visits as told by your doctor. This is important. How is this  prevented? To lower your risk of getting this condition again:  Wash your hands often with soap and water. If you cannot use soap and water, use hand sanitizer.  Avoid contact with people who have cold symptoms.  Try not to touch your hands to your mouth, nose, or eyes.  Make sure to get the flu shot every year. Contact a doctor if:  Your symptoms do not get better in 2 weeks. Get help right away if:  You cough up blood.  You have chest pain.  You have very bad shortness of breath.  You become dehydrated.  You faint (pass out) or keep feeling like you are going to pass out.  You keep throwing up (vomiting).  You have a very bad headache.  Your fever or chills gets worse. This information is not intended to replace advice given to you by your health care provider. Make sure you discuss any questions you have with your health care provider. Document Released: 04/06/2008 Document Revised: 06/02/2017 Document Reviewed: 04/08/2016 Elsevier Interactive Patient Education  2019 [employer].

## 2018-11-24 ENCOUNTER — Telehealth: Payer: Self-pay | Admitting: Emergency Medicine

## 2018-11-24 NOTE — Telephone Encounter (Signed)
Spoke to patient instructed her to stay hydrated and per provider continue cough medicine. Patient acknowledge understanding

## 2018-11-24 NOTE — Telephone Encounter (Signed)
Change in mucus color from clear to yellow typically means white blood cells are responding; can happen in viral and/or bacterial colds. Patient has only had symptoms for a short duration of time.  Recommend continue to increase fluids, rest, take previously prescribed cough medicine.  May return to [facility] for re-evaluation (can check her vital signs and listen to her lungs again); but typically viral upper respiratory infections (colds) take about 10 days to go away.  Hope she feels better soon!

## 2018-11-24 NOTE — Telephone Encounter (Signed)
Patient called back and stated now she is having yellow thick mucus that started this morning after her and I spoke. Was wondering if she needs something else. Uses [facility] pharmacy. Please advise

## 2018-11-24 NOTE — Telephone Encounter (Deleted)
Spoke with patient whom informed me that cough is better still taking medication that was prescribed. But now says last night she was having chills and fever didn't take her temperature did take tylenol last night. Patient asked me do I think she might have the Flu. I did inform patient that the issue she came in for was cough and we would have to see her for chills and fever if it persist. I asked patient how does she feel this morning she stated that she feels fine.I did instruct patient if she needed to come in for this issue we will be glad to see her.

## 2018-11-24 NOTE — Telephone Encounter (Signed)
Spoke to patient whom informed me that her cough is better. Still taking prescribed medication. But stated to me that last night she was having chills/fever took some tylenol last night. Did ask me if I thought if she was coming down with the flu.  I informed her this would be another issue that we would be happen to see her about. Patient stated she feels fine this morning. Instructed patient to stay hydrated and if any worse to contact us

## 2019-01-04 ENCOUNTER — Other Ambulatory Visit: Payer: Self-pay | Admitting: Family Medicine

## 2019-01-04 DIAGNOSIS — Z1231 Encounter for screening mammogram for malignant neoplasm of breast: Secondary | ICD-10-CM

## 2019-04-18 DIAGNOSIS — Z86010 Personal history of colonic polyps: Secondary | ICD-10-CM | POA: Diagnosis not present

## 2019-06-01 ENCOUNTER — Encounter: Admission: RE | Admit: 2019-06-01 | Payer: 59 | Source: Ambulatory Visit

## 2019-06-05 ENCOUNTER — Encounter: Admission: RE | Payer: Self-pay | Source: Home / Self Care

## 2019-06-05 ENCOUNTER — Ambulatory Visit: Admission: RE | Admit: 2019-06-05 | Payer: 59 | Source: Home / Self Care | Admitting: Unknown Physician Specialty

## 2019-06-05 SURGERY — COLONOSCOPY WITH PROPOFOL
Anesthesia: General

## 2019-07-31 DIAGNOSIS — Z0000 Encounter for general adult medical examination without abnormal findings: Secondary | ICD-10-CM | POA: Diagnosis not present

## 2019-07-31 DIAGNOSIS — E119 Type 2 diabetes mellitus without complications: Secondary | ICD-10-CM | POA: Diagnosis not present

## 2019-08-03 ENCOUNTER — Ambulatory Visit
Admission: RE | Admit: 2019-08-03 | Discharge: 2019-08-03 | Disposition: A | Discharge status: Home / Self Care | Payer: 59 | Source: Ambulatory Visit | Attending: Family Medicine | Admitting: Family Medicine

## 2019-08-03 DIAGNOSIS — Z1231 Encounter for screening mammogram for malignant neoplasm of breast: Secondary | ICD-10-CM | POA: Diagnosis not present

## 2019-08-07 DIAGNOSIS — E785 Hyperlipidemia, unspecified: Secondary | ICD-10-CM | POA: Diagnosis not present

## 2019-08-07 DIAGNOSIS — Z0000 Encounter for general adult medical examination without abnormal findings: Secondary | ICD-10-CM | POA: Diagnosis not present

## 2019-08-07 DIAGNOSIS — I10 Essential (primary) hypertension: Secondary | ICD-10-CM | POA: Diagnosis not present

## 2019-08-07 DIAGNOSIS — E119 Type 2 diabetes mellitus without complications: Secondary | ICD-10-CM | POA: Diagnosis not present

## 2019-09-11 ENCOUNTER — Other Ambulatory Visit: Payer: Self-pay

## 2019-12-04 ENCOUNTER — Ambulatory Visit: Admit: 2019-12-04 | Payer: 59 | Admitting: Internal Medicine

## 2019-12-04 SURGERY — COLONOSCOPY WITH PROPOFOL
Anesthesia: General

## 2020-03-21 ENCOUNTER — Other Ambulatory Visit
Admission: RE | Admit: 2020-03-21 | Discharge: 2020-03-21 | Disposition: A | Discharge status: Home / Self Care | Payer: Medicare Other | Source: Ambulatory Visit | Attending: Internal Medicine | Admitting: Internal Medicine

## 2020-03-21 DIAGNOSIS — Z20822 Contact with and (suspected) exposure to covid-19: Secondary | ICD-10-CM | POA: Insufficient documentation

## 2020-03-21 DIAGNOSIS — Z01812 Encounter for preprocedural laboratory examination: Secondary | ICD-10-CM | POA: Diagnosis present

## 2020-03-22 ENCOUNTER — Encounter: Payer: Self-pay | Admitting: Internal Medicine

## 2020-03-22 LAB — SARS CORONAVIRUS 2 (TAT 6-24 HRS): SARS Coronavirus 2: NEGATIVE

## 2020-03-22 MED ORDER — SODIUM CHLORIDE 0.9 % IV SOLN
INTRAVENOUS | Status: DC
  Administered 2020-03-25: 1000 mL via INTRAVENOUS

## 2020-03-25 ENCOUNTER — Other Ambulatory Visit: Payer: Self-pay

## 2020-03-25 ENCOUNTER — Encounter
Admission: RE | Disposition: A | Discharge status: Home / Self Care | Payer: Self-pay | Source: Home / Self Care | Attending: Internal Medicine

## 2020-03-25 ENCOUNTER — Ambulatory Visit
Admission: RE | Admit: 2020-03-25 | Discharge: 2020-03-25 | Disposition: A | Discharge status: Home / Self Care | Payer: Medicare Other | Attending: Internal Medicine | Admitting: Internal Medicine

## 2020-03-25 ENCOUNTER — Ambulatory Visit: Payer: Medicare Other | Admitting: Anesthesiology

## 2020-03-25 ENCOUNTER — Encounter: Payer: Self-pay | Admitting: Internal Medicine

## 2020-03-25 DIAGNOSIS — K5730 Diverticulosis of large intestine without perforation or abscess without bleeding: Secondary | ICD-10-CM | POA: Insufficient documentation

## 2020-03-25 DIAGNOSIS — D128 Benign neoplasm of rectum: Secondary | ICD-10-CM | POA: Insufficient documentation

## 2020-03-25 DIAGNOSIS — E119 Type 2 diabetes mellitus without complications: Secondary | ICD-10-CM | POA: Diagnosis not present

## 2020-03-25 DIAGNOSIS — Z85820 Personal history of malignant melanoma of skin: Secondary | ICD-10-CM | POA: Insufficient documentation

## 2020-03-25 DIAGNOSIS — E1151 Type 2 diabetes mellitus with diabetic peripheral angiopathy without gangrene: Secondary | ICD-10-CM | POA: Insufficient documentation

## 2020-03-25 DIAGNOSIS — K640 First degree hemorrhoids: Secondary | ICD-10-CM | POA: Insufficient documentation

## 2020-03-25 DIAGNOSIS — Z79899 Other long term (current) drug therapy: Secondary | ICD-10-CM | POA: Insufficient documentation

## 2020-03-25 DIAGNOSIS — I10 Essential (primary) hypertension: Secondary | ICD-10-CM | POA: Diagnosis not present

## 2020-03-25 DIAGNOSIS — E785 Hyperlipidemia, unspecified: Secondary | ICD-10-CM | POA: Insufficient documentation

## 2020-03-25 DIAGNOSIS — M1990 Unspecified osteoarthritis, unspecified site: Secondary | ICD-10-CM | POA: Diagnosis not present

## 2020-03-25 DIAGNOSIS — Z1211 Encounter for screening for malignant neoplasm of colon: Secondary | ICD-10-CM | POA: Insufficient documentation

## 2020-03-25 DIAGNOSIS — Z87891 Personal history of nicotine dependence: Secondary | ICD-10-CM | POA: Diagnosis not present

## 2020-03-25 DIAGNOSIS — Z86010 Personal history of colonic polyps: Secondary | ICD-10-CM | POA: Diagnosis not present

## 2020-03-25 DIAGNOSIS — D123 Benign neoplasm of transverse colon: Secondary | ICD-10-CM | POA: Diagnosis not present

## 2020-03-25 HISTORY — PX: COLONOSCOPY WITH PROPOFOL: SHX5780

## 2020-03-25 SURGERY — COLONOSCOPY WITH PROPOFOL
Anesthesia: General

## 2020-03-25 MED ORDER — PROPOFOL 10 MG/ML IV BOLUS
INTRAVENOUS | Status: DC | PRN
  Administered 2020-03-25: 80 mg via INTRAVENOUS

## 2020-03-25 MED ORDER — LIDOCAINE HCL (CARDIAC) PF 100 MG/5ML IV SOSY
PREFILLED_SYRINGE | INTRAVENOUS | Status: DC | PRN
  Administered 2020-03-25: 40 mg via INTRAVENOUS

## 2020-03-25 MED ORDER — PROPOFOL 500 MG/50ML IV EMUL
INTRAVENOUS | Status: DC | PRN
  Administered 2020-03-25: 125 ug/kg/min via INTRAVENOUS

## 2020-03-25 NOTE — Interval H&P Note (Signed)
History and Physical Interval Note:  03/25/2020 1:16 PM  Kendra Downs  has presented today for surgery, with the diagnosis of hx colon polyps.  The various methods of treatment have been discussed with the patient and family. After consideration of risks, benefits and other options for treatment, the patient has consented to  Procedure(s): COLONOSCOPY WITH PROPOFOL (N/A) as a surgical intervention.  The patient's history has been reviewed, patient examined, no change in status, stable for surgery.  I have reviewed the patient's chart and labs.  Questions were answered to the patient's satisfaction.     [place], [place]

## 2020-03-25 NOTE — Transfer of Care (Signed)
Immediate Anesthesia Transfer of Care Note  Patient: Kendra Downs  Procedure(s) Performed: COLONOSCOPY WITH PROPOFOL (N/A )  Patient Location: PACU and Endoscopy Unit  Anesthesia Type:General  Level of Consciousness: drowsy  Airway & Oxygen Therapy: Patient Spontanous Breathing and Patient connected to nasal cannula oxygen  Post-op Assessment: Report given to RN and Post -op Vital signs reviewed and stable  Post vital signs: Reviewed and stable  Last Vitals:  Vitals Value Taken Time  BP    Temp    Pulse 69 03/25/20 1420  Resp 13 03/25/20 1420  SpO2 95 % 03/25/20 1420  Vitals shown include unvalidated device data.  Last Pain:  Vitals:   03/25/20 1307  TempSrc: Temporal      Patients Stated Pain Goal: 0 ([id number])  Complications: No apparent anesthesia complications

## 2020-03-25 NOTE — H&P (Signed)
Outpatient short stay form Pre-procedure 03/25/2020 1:15 PM Mardel Grudzien K. Alice Reichert, M.D.  Primary Physician:  [place] Pink, M.D.  Reason for visit:  Personal hx of colon polyps  History of present illness:                           Patient presents for colonoscopy for a personal hx of colon polyps. The patient denies abdominal pain, abnormal weight loss or rectal bleeding.      Current Facility-Administered Medications:  .  0.9 %  sodium chloride infusion, , Intravenous, Continuous, Gopal Malter, Benay Pike, MD  Medications Prior to Admission  Medication Sig Dispense Refill Last Dose  . fluticasone (FLONASE) 50 MCG/ACT nasal spray Place 2 sprays into both nostrils daily. 16 g 6 Past Month at Unknown time  . glucose blood (FREESTYLE LITE) test strip USE 1 STRIP VIA METER ONCE A DAY AS DIRECTED   03/25/2020 at Unknown time  . hydrochlorothiazide (MICROZIDE) 12.5 MG capsule Take 12.5 mg by mouth daily.   03/25/2020 at Unknown time  . NUTRITIONAL SUPPLEMENT LIQD Take 1 scoop by mouth daily. Isagenix shake powder multivitamin   Past Week at Unknown time  . quinapril-hydrochlorothiazide (ACCURETIC) 20-12.5 MG tablet Take 1 tablet by mouth daily.    03/25/2020 at Unknown time  . sodium chloride (OCEAN) 0.65 % SOLN nasal spray Place 1 spray into both nostrils as needed for congestion. 60 mL 0 Past Week at Unknown time  . traMADol (ULTRAM) 50 MG tablet Take 1-2 tablets (50-100 mg total) every 4 (four) hours as needed by mouth for moderate pain. 60 tablet 0 Past Month at Unknown time  . Vitamin D, Ergocalciferol, (DRISDOL) 50000 units CAPS capsule Take 50,000 Units by mouth every 7 (seven) days. Either Wednesday or Thursday   Past Week at Unknown time  . benzonatate (TESSALON) 100 MG capsule Take 1 capsule (100 mg total) by mouth 2 (two) times daily as needed for cough. (Patient not taking: Reported on 03/25/2020) 20 capsule 0 Completed Course at Unknown time  . brompheniramine-pseudoephedrine-DM 30-2-10 MG/5ML  syrup Take 5 mLs by mouth 4 (four) times daily as needed. (Patient not taking: Reported on 03/25/2020) 120 mL 0 Completed Course at Unknown time  . MICROLET LANCETS MISC Microlet Lancet     . Omega-3 Fatty Acids (OMEGA-3 FISH OIL PO) Take by mouth daily.   Completed Course at Unknown time  . rosuvastatin (CRESTOR) 5 MG tablet Take by mouth.        No Known Allergies   Past Medical History:  Diagnosis Date  . Arthritis   . Cancer ([place])    right arm - skin melanoma  . Depression    with loss of son  . Diabetes mellitus without complication (HCC)    prediabetic  . Hyperlipidemia 2014  . Hypertension   . Pain    right knee  . Pre-diabetes   . PVD (peripheral vascular disease) ([place])     Review of systems:  Otherwise negative.    Physical Exam  Gen: Alert, oriented. Appears stated age.  HEENT: [place]/AT. PERRLA. Lungs: CTA, no wheezes. CV: RR nl S1, S2. Abd: soft, benign, no masses. BS+ Ext: No edema. Pulses 2+    Planned procedures: Proceed with colonoscopy. The patient understands the nature of the planned procedure, indications, risks, alternatives and potential complications including but not limited to bleeding, infection, perforation, damage to internal organs and possible oversedation/side effects from anesthesia. The patient agrees and gives consent  to proceed.  Please refer to procedure notes for findings, recommendations and patient disposition/instructions.     Karenann Mcgrory K. Alice Reichert, M.D. Gastroenterology 03/25/2020  1:15 PM

## 2020-03-25 NOTE — Op Note (Signed)
[facility] Gastroenterology Patient Name: Jaela Coller Procedure Date: 03/25/2020 1:46 PM MRN: [id number] Account #: [id number] Date of Birth: [birth year] Admit Type: Outpatient Age: 74 Room: [facility] ENDO ROOM 3 Gender: Female Note Status: Finalized Procedure:             Colonoscopy Indications:           High risk colon cancer surveillance: Personal history                         of colonic polyps Providers:             Benay Pike. Alice Reichert MD, MD Referring MD:          [facility] ***Barton Dubois (Referring MD), Lovie Macadamia, MD (Referring MD) Medicines:             Propofol per Anesthesia Complications:         No immediate complications. Procedure:             Pre-Anesthesia Assessment:                        - The risks and benefits of the procedure and the                         sedation options and risks were discussed with the                         patient. All questions were answered and informed                         consent was obtained.                        - Patient identification and proposed procedure were                         verified prior to the procedure by the nurse. The                         procedure was verified in the procedure room.                        - ASA Grade Assessment: III - A patient with severe                         systemic disease.                        - After reviewing the risks and benefits, the patient                         was deemed in satisfactory condition to undergo the                         procedure.                        After obtaining informed consent, the colonoscope  was                         passed under direct vision. Throughout the procedure,                         the patient's blood pressure, pulse, and oxygen                         saturations were monitored continuously. The                         Colonoscope was introduced through the anus and                        advanced to the the cecum, identified by appendiceal                         orifice and ileocecal valve. The colonoscopy was                         somewhat difficult due to significant looping.                         Successful completion of the procedure was aided by                         applying abdominal pressure. The patient tolerated the                         procedure well. The quality of the bowel preparation                         was adequate. The ileocecal valve, appendiceal                         orifice, and rectum were photographed. Findings:      The perianal and digital rectal examinations were normal. Pertinent       negatives include normal sphincter tone and no palpable rectal lesions.      Non-bleeding internal hemorrhoids were found during retroflexion. The       hemorrhoids were Grade I (internal hemorrhoids that do not prolapse).      A 4 mm polyp was found in the hepatic flexure. The polyp was sessile.       The polyp was removed with a jumbo cold forceps. Resection and retrieval       were complete.      Three semi-pedunculated polyps were found in the rectum. The polyps were       4 to 9 mm in size. These polyps were removed with a hot snare. Resection       and retrieval were complete.      Multiple small and large-mouthed diverticula were found in the sigmoid       colon. There was no evidence of diverticular bleeding.      Many small-mouthed diverticula were found in the descending colon. There       was no evidence of diverticular bleeding.      The exam was otherwise without abnormality. Impression:            -  Non-bleeding internal hemorrhoids.                        - One 4 mm polyp at the hepatic flexure, removed with                         a jumbo cold forceps. Resected and retrieved.                        - Three 4 to 9 mm polyps in the rectum, removed with a                         hot snare. Resected and retrieved.                         - Severe diverticulosis in the sigmoid colon. There                         was no evidence of diverticular bleeding.                        - Mild diverticulosis in the descending colon. There                         was no evidence of diverticular bleeding.                        - The examination was otherwise normal. Recommendation:        - Patient has a contact number available for                         emergencies. The signs and symptoms of potential                         delayed complications were discussed with the patient.                         Return to normal activities tomorrow. Written                         discharge instructions were provided to the patient.                        - Resume previous diet.                        - Continue present medications.                        - Await pathology results.                        - Repeat colonoscopy is recommended for surveillance.                         The colonoscopy date will be determined after                         pathology results from today's exam become available  for review.                        - Return to GI office PRN.                        - The findings and recommendations were discussed with                         the patient. Procedure Code(s):     --- Professional ---                        [phone], Colonoscopy, flexible; with removal of                         tumor(s), polyp(s), or other lesion(s) by snare                         technique                        45380, 31, Colonoscopy, flexible; with biopsy, single                         or multiple Diagnosis Code(s):     --- Professional ---                        K57.30, Diverticulosis of large intestine without                         perforation or abscess without bleeding                        K64.0, First degree hemorrhoids                        K62.1, Rectal polyp                         K63.5, Polyp of colon                        Z86.010, Personal history of colonic polyps CPT copyright 2019 American Medical Association. All rights reserved. The codes documented in this report are preliminary and upon coder review may  be revised to meet current compliance requirements. Efrain Sella MD, MD 03/25/2020 2:19:21 PM This report has been signed electronically. Number of Addenda: 0 Note Initiated On: 03/25/2020 1:46 PM Scope Withdrawal Time: 0 hours 10 minutes 20 seconds  Total Procedure Duration: 0 hours 18 minutes 23 seconds  Estimated Blood Loss:  Estimated blood loss: none.      [facility]

## 2020-03-25 NOTE — Anesthesia Postprocedure Evaluation (Signed)
Anesthesia Post Note  Patient: Kendra Downs  Procedure(s) Performed: COLONOSCOPY WITH PROPOFOL (N/A )  Patient location during evaluation: Endoscopy Anesthesia Type: General Level of consciousness: awake and alert Pain management: pain level controlled Vital Signs Assessment: post-procedure vital signs reviewed and stable Respiratory status: spontaneous breathing, nonlabored ventilation, respiratory function stable and patient connected to nasal cannula oxygen Cardiovascular status: blood pressure returned to baseline and stable Postop Assessment: no apparent nausea or vomiting Anesthetic complications: no     Last Vitals:  Vitals:   03/25/20 1430 03/25/20 1440  BP: 131/70 (!) 144/69  Pulse: (!) 55 (!) 50  Resp: 20 17  Temp:    SpO2: 95% 98%    Last Pain:  Vitals:   03/25/20 1440  TempSrc:   PainSc: 0-No pain                 Arita Miss

## 2020-03-25 NOTE — Anesthesia Preprocedure Evaluation (Signed)
Anesthesia Evaluation  Patient identified by MRN, date of birth, ID band Patient awake    Reviewed: Allergy & Precautions, H&P , NPO status , Patient's Chart, lab work & pertinent test results  History of Anesthesia Complications Negative for: history of anesthetic complications  Airway Mallampati: III  TM Distance: <3 FB Neck ROM: limited    Dental no notable dental hx. (+) Chipped, Missing, Edentulous Upper, Edentulous Lower, Upper Dentures, Lower Dentures   Pulmonary neg pulmonary ROS, neg shortness of breath, neg sleep apnea, neg COPD, Patient abstained from smoking.Not current smoker, former smoker,    Pulmonary exam normal breath sounds clear to auscultation       Cardiovascular Exercise Tolerance: Good METShypertension, (-) angina+ Peripheral Vascular Disease  (-) CAD, (-) Past MI and (-) DOE (-) dysrhythmias  Rhythm:Regular Rate:Normal - Systolic murmurs    Neuro/Psych PSYCHIATRIC DISORDERS Depression negative neurological ROS     GI/Hepatic negative GI ROS, Neg liver ROS, neg GERD  ,  Endo/Other  diabetes, Type 2  Renal/GU negative Renal ROS     Musculoskeletal  (+) Arthritis ,   Abdominal   Peds  Hematology negative hematology ROS (+)   Anesthesia Other Findings Past Medical History: No date: Arthritis No date: Cancer ([place])     Comment:  right arm - skin melanoma No date: Depression     Comment:  with loss of son No date: Diabetes mellitus without complication ([place])     Comment:  prediabetic 2014: Hyperlipidemia No date: Hypertension No date: Pain     Comment:  right knee No date: Pre-diabetes No date: PVD (peripheral vascular disease) ([place])  Past Surgical History: No date: BREAST CYST ASPIRATION; Right     Comment:  cyst No date: FRACTURE SURGERY     Comment:  age 89 yrs 11/09/2016: JOINT REPLACEMENT; Right october 2013: LASER ABLATION     Comment:  venous ulcer No date: SKIN CANCER  EXCISION  BMI    Body Mass Index:  36.61 kg/m      Reproductive/Obstetrics negative OB ROS                             Anesthesia Physical  Anesthesia Plan  ASA: II  Anesthesia Plan: General   Post-op Pain Management:    Induction: Intravenous  PONV Risk Score and Plan: 3 and Ondansetron, Propofol infusion and TIVA  Airway Management Planned: Nasal Cannula  Additional Equipment: None  Intra-op Plan:   Post-operative Plan:   Informed Consent: I have reviewed the patients History and Physical, chart, labs and discussed the procedure including the risks, benefits and alternatives for the proposed anesthesia with the patient or authorized representative who has indicated his/her understanding and acceptance.     Dental advisory given  Plan Discussed with: CRNA and Surgeon  Anesthesia Plan Comments: (Discussed risks of anesthesia with patient, including possibility of difficulty with spontaneous ventilation under anesthesia necessitating airway intervention, PONV, and rare risks such as cardiac or respiratory or neurological events. Patient understands.)        Anesthesia Quick Evaluation

## 2020-03-26 ENCOUNTER — Encounter: Payer: Self-pay | Admitting: *Deleted

## 2020-03-27 LAB — SURGICAL PATHOLOGY

## 2020-10-29 ENCOUNTER — Other Ambulatory Visit: Payer: Self-pay | Admitting: Family Medicine

## 2020-10-29 DIAGNOSIS — Z1231 Encounter for screening mammogram for malignant neoplasm of breast: Secondary | ICD-10-CM

## 2020-11-27 ENCOUNTER — Other Ambulatory Visit: Payer: Self-pay

## 2020-11-27 ENCOUNTER — Ambulatory Visit
Admission: RE | Admit: 2020-11-27 | Discharge: 2020-11-27 | Disposition: A | Discharge status: Home / Self Care | Payer: Medicare Other | Source: Ambulatory Visit | Attending: Family Medicine | Admitting: Family Medicine

## 2020-11-27 DIAGNOSIS — Z1231 Encounter for screening mammogram for malignant neoplasm of breast: Secondary | ICD-10-CM | POA: Insufficient documentation

## 2022-04-06 ENCOUNTER — Other Ambulatory Visit: Payer: Self-pay | Admitting: Family Medicine

## 2022-04-06 DIAGNOSIS — Z1231 Encounter for screening mammogram for malignant neoplasm of breast: Secondary | ICD-10-CM

## 2022-04-08 ENCOUNTER — Encounter: Payer: Self-pay | Admitting: Otolaryngology

## 2022-04-09 NOTE — Discharge Instructions (Signed)
[facility] ENDOSCOPIC SINUS SURGERY [facility]  What is Functional Endoscopic Sinus Surgery?  The Surgery involves making the natural openings of the sinuses larger by removing the bony partitions that separate the sinuses from the nasal cavity.  The natural sinus lining is preserved as much as possible to allow the sinuses to resume normal function after the surgery.  In some patients nasal polyps (excessively swollen lining of the sinuses) may be removed to relieve obstruction of the sinus openings.  The surgery is performed through the nose using lighted scopes, which eliminates the need for incisions on the face.  A septoplasty is a different procedure which is sometimes performed with sinus surgery.  It involves straightening the boy partition that separates the two sides of your nose.  A crooked or deviated septum may need repair if is obstructing the sinuses or nasal airflow.  Turbinate reduction is also often performed during sinus surgery.  The turbinates are bony proturberances from the side walls of the nose which swell and can obstruct the nose in patients with sinus and allergy problems.  Their size can be surgically reduced to help relieve nasal obstruction.  What Can Sinus Surgery Do For Me?  Sinus surgery can reduce the frequency of sinus infections requiring antibiotic treatment.  This can provide improvement in nasal congestion, post-nasal drainage, facial pressure and nasal obstruction.  Surgery will NOT prevent you from ever having an infection again, so it usually only for patients who get infections 4 or more times yearly requiring antibiotics, or for infections that do not clear with antibiotics.  It will not cure nasal allergies, so patients with allergies may still require medication to treat their allergies after surgery. Surgery may improve headaches related to sinusitis, however, some people will continue to  require medication to control sinus headaches related to allergies.  Surgery will do nothing for other forms of headache (migraine, tension or cluster).  What Are the Risks of Endoscopic Sinus Surgery?  Current techniques allow surgery to be performed safely with little risk, however, there are rare complications that patients should be aware of.  Because the sinuses are located around the eyes, there is risk of eye injury, including blindness, though again, this would be quite rare. This is usually a result of bleeding behind the eye during surgery, which can effect vision, though there are treatments to protect the vision and prevent permanent injury. More serious complications would include bleeding inside the brain cavity or damage to the brain.This happens when the fluid around the brain leaks out into the sinus cavity.  Again, all of these complications are uncommon, and spinal fluid leaks can be safely managed surgically if they occur.  The most common complication of sinus surgery is bleeding from the nose, which may require packing or cauterization of the nose.  Patients with polyps may experience recurrence of the polyps that would require revision surgery.  Alterations of sense of smell or injury to the tear ducts are also rare complications.   What is the Surgery Like, and what is the Recovery?  The Surgery usually takes a couple of hours to perform, and is usually performed under a general anesthetic (completely asleep).  Patients are usually discharged home after a couple of hours.  Sometimes during surgery it is necessary to pack the nose to control bleeding, and the packing is left in place for 24 - 48 hours, and removed by your surgeon.  If   a septoplasty was performed during the procedure, there is often a splint placed which must be removed after 5-7 days.   Discomfort: Pain is usually mild to moderate, and can be controlled by prescription pain medication or acetaminophen (Tylenol).   Aspirin, Ibuprofen (Advil, Motrin), or Naprosyn (Aleve) should be avoided, as they can cause increased bleeding.  Most patients feel sinus pressure like they have a bad head cold for several days.  Sleeping with your head elevated can help reduce swelling and facial pressure, as can ice packs over the face.  A humidifier may be helpful to keep the mucous and blood from drying in the nose.   Diet: There are no specific diet restrictions, however, you should generally start with clear liquids and a light diet of bland foods because the anesthetic can cause some nausea.  Advance your diet depending on how your stomach feels.  Taking your pain medication with food will often help reduce stomach upset which pain medications can cause.  Nasal Saline Irrigation: It is important to remove blood clots and dried mucous from the nose as it is healing.  This is done by having you irrigate the nose at least 3 - 4 times daily with a salt water solution.  We recommend using NeilMed Sinus Rinse (available at the drug store).  Fill the squeeze bottle with the solution, bend over a sink, and insert the tip of the squeeze bottle into the nose  of an inch.  Point the tip of the squeeze bottle towards the inside corner of the eye on the same side your irrigating.  Squeeze the bottle and gently irrigate the nose.  If you bend forward as you do this, most of the fluid will flow back out of the nose, instead of down your throat.   The solution should be warm, near body temperature, when you irrigate.   Each time you irrigate, you should use a full squeeze bottle.   Note that if you are instructed to use Nasal Steroid Sprays at any time after your surgery, irrigate with saline BEFORE using the steroid spray, so you do not wash it all out of the nose. Another product, Nasal Saline Gel (such as AYR Nasal Saline Gel) can be applied in each nostril 3 - 4 times daily to moisture the nose and reduce scabbing or crusting.  Bleeding:   Bloody drainage from the nose can be expected for several days, and patients are instructed to irrigate their nose frequently with salt water to help remove mucous and blood clots.  The drainage may be dark red or brown, though some fresh blood may be seen intermittently, especially after irrigation.  Do not blow you nose, as bleeding may occur. If you must sneeze, keep your mouth open to allow air to escape through your mouth.  If heavy bleeding occurs: Irrigate the nose with saline to rinse out clots, then spray the nose 3 - 4 times with Afrin Nasal Decongestant Spray.  The spray will constrict the blood vessels to slow bleeding.  Pinch the lower half of your nose shut to apply pressure, and lay down with your head elevated.  Ice packs over the nose may help as well. If bleeding persists despite these measures, you should notify your doctor.  Do not use the Afrin routinely to control nasal congestion after surgery, as it can result in worsening congestion and may affect healing.     Activity: Return to work varies among patients. Most patients will be out   of work at least 5 - 7 days to recover.  Patient may return to work after they are off of narcotic pain medication, and feeling well enough to perform the functions of their job.  Patients must avoid heavy lifting (over 10 pounds) or strenuous physical for 2 weeks after surgery, so your employer may need to assign you to light duty, or keep you out of work longer if light duty is not possible.  NOTE: you should not drive, operate dangerous machinery, do any mentally demanding tasks or make any important legal or financial decisions while on narcotic pain medication and recovering from the general anesthetic.    Call Your Doctor Immediately if You Have Any of the Following: Bleeding that you cannot control with the above measures Loss of vision, double vision, bulging of the eye or black eyes. Fever over 101 degrees Neck stiffness with severe headache,  fever, nausea and change in mental state. You are always encouraged to call anytime with concerns, however, please call with requests for pain medication refills during office hours.  Office Endoscopy: During follow-up visits your doctor will remove any packing or splints that may have been placed and evaluate and clean your sinuses endoscopically.  Topical anesthetic will be used to make this as comfortable as possible, though you may want to take your pain medication prior to the visit.  How often this will need to be done varies from patient to patient.  After complete recovery from the surgery, you may need follow-up endoscopy from time to time, particularly if there is concern of recurrent infection or nasal polyps.  

## 2022-04-15 ENCOUNTER — Other Ambulatory Visit: Payer: Self-pay

## 2022-04-15 ENCOUNTER — Encounter
Admission: RE | Disposition: A | Discharge status: Home / Self Care | Payer: Self-pay | Source: Home / Self Care | Attending: Otolaryngology

## 2022-04-15 ENCOUNTER — Ambulatory Visit
Admission: RE | Admit: 2022-04-15 | Discharge: 2022-04-15 | Disposition: A | Discharge status: Home / Self Care | Payer: Medicare Other | Attending: Otolaryngology | Admitting: Otolaryngology

## 2022-04-15 ENCOUNTER — Ambulatory Visit: Payer: Medicare Other | Admitting: Anesthesiology

## 2022-04-15 ENCOUNTER — Encounter: Payer: Self-pay | Admitting: Otolaryngology

## 2022-04-15 DIAGNOSIS — J339 Nasal polyp, unspecified: Secondary | ICD-10-CM | POA: Diagnosis present

## 2022-04-15 DIAGNOSIS — J329 Chronic sinusitis, unspecified: Secondary | ICD-10-CM | POA: Insufficient documentation

## 2022-04-15 DIAGNOSIS — E1151 Type 2 diabetes mellitus with diabetic peripheral angiopathy without gangrene: Secondary | ICD-10-CM | POA: Diagnosis not present

## 2022-04-15 DIAGNOSIS — Q8589 Other phakomatoses, not elsewhere classified: Secondary | ICD-10-CM | POA: Insufficient documentation

## 2022-04-15 DIAGNOSIS — J3489 Other specified disorders of nose and nasal sinuses: Secondary | ICD-10-CM | POA: Insufficient documentation

## 2022-04-15 DIAGNOSIS — Z87891 Personal history of nicotine dependence: Secondary | ICD-10-CM | POA: Insufficient documentation

## 2022-04-15 DIAGNOSIS — I10 Essential (primary) hypertension: Secondary | ICD-10-CM | POA: Diagnosis not present

## 2022-04-15 HISTORY — PX: POLYPECTOMY: SHX149

## 2022-04-15 HISTORY — PX: IMAGE GUIDED SINUS SURGERY: SHX6570

## 2022-04-15 HISTORY — PX: BALLOON SINUPLASTY: SHX5740

## 2022-04-15 HISTORY — DX: Presence of dental prosthetic device (complete) (partial): Z97.2

## 2022-04-15 SURGERY — SINUS SURGERY, WITH IMAGING GUIDANCE
Anesthesia: General

## 2022-04-15 MED ORDER — OXYCODONE HCL 5 MG PO TABS
5.0000 mg | ORAL_TABLET | Freq: Once | ORAL | Status: AC | PRN
  Administered 2022-04-15: 5 mg via ORAL

## 2022-04-15 MED ORDER — ONDANSETRON HCL 4 MG PO TABS: 4.0000 mg | ORAL_TABLET | Freq: Three times a day (TID) | ORAL | 0 refills | Status: AC | PRN

## 2022-04-15 MED ORDER — PROPOFOL 10 MG/ML IV BOLUS
INTRAVENOUS | Status: DC | PRN
  Administered 2022-04-15: 100 mg via INTRAVENOUS

## 2022-04-15 MED ORDER — DEXAMETHASONE SODIUM PHOSPHATE 4 MG/ML IJ SOLN
INTRAMUSCULAR | Status: DC | PRN
  Administered 2022-04-15: 4 mg via INTRAVENOUS

## 2022-04-15 MED ORDER — FENTANYL CITRATE (PF) 100 MCG/2ML IJ SOLN
INTRAMUSCULAR | Status: DC | PRN
Start: 2022-04-15 — End: 2022-04-15
  Administered 2022-04-15: 25 ug via INTRAVENOUS
  Administered 2022-04-15: 50 ug via INTRAVENOUS
  Administered 2022-04-15: 25 ug via INTRAVENOUS

## 2022-04-15 MED ORDER — LIDOCAINE HCL (CARDIAC) PF 100 MG/5ML IV SOSY
PREFILLED_SYRINGE | INTRAVENOUS | Status: DC | PRN
  Administered 2022-04-15: 50 mg via INTRAVENOUS

## 2022-04-15 MED ORDER — DOXYCYCLINE MONOHYDRATE 100 MG PO CAPS: 100.0000 mg | ORAL_CAPSULE | Freq: Two times a day (BID) | ORAL | 0 refills | Status: AC

## 2022-04-15 MED ORDER — LIDOCAINE-EPINEPHRINE 1 %-1:100000 IJ SOLN
INTRAMUSCULAR | Status: DC | PRN
  Administered 2022-04-15: 6 mL

## 2022-04-15 MED ORDER — ONDANSETRON HCL 4 MG/2ML IJ SOLN
INTRAMUSCULAR | Status: DC | PRN
  Administered 2022-04-15: 4 mg via INTRAVENOUS

## 2022-04-15 MED ORDER — OXYCODONE HCL 5 MG/5ML PO SOLN: 5.0000 mg | Freq: Once | ORAL | Status: AC | PRN

## 2022-04-15 MED ORDER — MIDAZOLAM HCL 5 MG/5ML IJ SOLN
INTRAMUSCULAR | Status: DC | PRN
  Administered 2022-04-15: 2 mg via INTRAVENOUS

## 2022-04-15 MED ORDER — PREDNISONE 10 MG (21) PO TBPK
ORAL_TABLET | ORAL | 0 refills | Status: DC
Start: 2022-04-15 — End: 2022-10-27

## 2022-04-15 MED ORDER — FENTANYL CITRATE PF 50 MCG/ML IJ SOSY: 25.0000 ug | PREFILLED_SYRINGE | INTRAMUSCULAR | Status: DC | PRN

## 2022-04-15 MED ORDER — SUCCINYLCHOLINE CHLORIDE 200 MG/10ML IV SOSY
PREFILLED_SYRINGE | INTRAVENOUS | Status: DC | PRN
  Administered 2022-04-15: 100 mg via INTRAVENOUS

## 2022-04-15 MED ORDER — LACTATED RINGERS IV SOLN: INTRAVENOUS | Status: DC

## 2022-04-15 MED ORDER — OXYMETAZOLINE HCL 0.05 % NA SOLN
NASAL | Status: DC | PRN
  Administered 2022-04-15: 1 via TOPICAL

## 2022-04-15 MED ORDER — GLYCOPYRROLATE 0.2 MG/ML IJ SOLN
INTRAMUSCULAR | Status: DC | PRN
  Administered 2022-04-15: .2 mg via INTRAVENOUS

## 2022-04-15 MED ORDER — DEXMEDETOMIDINE (PRECEDEX) IN NS 20 MCG/5ML (4 MCG/ML) IV SYRINGE
PREFILLED_SYRINGE | INTRAVENOUS | Status: DC | PRN
  Administered 2022-04-15 (×2): 5 ug via INTRAVENOUS

## 2022-04-15 MED ORDER — HYDROCODONE-ACETAMINOPHEN 5-325 MG PO TABS: 1.0000 | ORAL_TABLET | ORAL | 0 refills | Status: AC | PRN

## 2022-04-15 MED ORDER — ONDANSETRON HCL 4 MG/2ML IJ SOLN
4.0000 mg | Freq: Once | INTRAMUSCULAR | Status: DC | PRN
Start: 2022-04-15 — End: 2022-04-15

## 2022-04-15 MED ORDER — ACETAMINOPHEN 10 MG/ML IV SOLN
INTRAVENOUS | Status: DC | PRN
  Administered 2022-04-15: 1000 mg via INTRAVENOUS

## 2022-04-15 SURGICAL SUPPLY — 31 items
BLADE SHAVER TRUDI 4 15 DEG (ENT DISPOSABLE) ×1 IMPLANT
BLADE SHAVER TRUDI 4 40 DEG (ENT DISPOSABLE) IMPLANT
BLADE SHAVER TRUDI STR 4 (ENT DISPOSABLE) IMPLANT
CABLE TRUDI DISPOSABLE (ENT DISPOSABLE) ×6 IMPLANT
CANISTER SUCT 1200ML W/VALVE (MISCELLANEOUS) ×3 IMPLANT
CONMED SUCTION COAGULATOR ×1 IMPLANT
DEVICE INFLATION SEID (MISCELLANEOUS) ×1 IMPLANT
DRESSING NASL FOAM PST OP SINU (MISCELLANEOUS) IMPLANT
DRSG NASAL FOAM POST OP SINU (MISCELLANEOUS)
ELECT REM PT RETURN 9FT ADLT (ELECTROSURGICAL) ×3
ELECTRODE REM PT RTRN 9FT ADLT (ELECTROSURGICAL) ×2 IMPLANT
GLOVE SURG GAMMEX PI TX LF 7.5 (GLOVE) ×7 IMPLANT
GOWN STRL REUS W/ TWL LRG LVL3 (GOWN DISPOSABLE) ×2 IMPLANT
GOWN STRL REUS W/TWL LRG LVL3 (GOWN DISPOSABLE) ×3
HEMOSTAT ARISTA ABSORB 3G PWDR (HEMOSTASIS) ×2 IMPLANT
IV NS 500ML (IV SOLUTION) ×3
IV NS 500ML BAXH (IV SOLUTION) ×2 IMPLANT
KIT TURNOVER KIT A (KITS) ×3 IMPLANT
NS IRRIG 500ML POUR BTL (IV SOLUTION) ×3 IMPLANT
PACK ENT CUSTOM (PACKS) ×3 IMPLANT
PACKING NASAL EPIS 4X2.4 XEROG (MISCELLANEOUS) ×2 IMPLANT
PATTIES SURGICAL .5 X3 (DISPOSABLE) ×4 IMPLANT
SOL ANTI-FOG 6CC FOG-OUT (MISCELLANEOUS) ×2 IMPLANT
SOL FOG-OUT ANTI-FOG 6CC (MISCELLANEOUS) ×1
STRAP BODY AND KNEE 60X3 (MISCELLANEOUS) ×3 IMPLANT
SYR 10ML LL (SYRINGE) ×3 IMPLANT
SYR EAR/ULCER 2OZ (SYRINGE) ×3 IMPLANT
SYSTEM BALLOON SINUPLASTY 6X16 (SINUPLASTY) ×1 IMPLANT
TRACKER DISPOSABLE PAITIENT (MISCELLANEOUS) ×3 IMPLANT
TUBING IRRIGATION BIEN-AIR (TUBING) ×3 IMPLANT
WATER STERILE IRR 250ML POUR (IV SOLUTION) ×1 IMPLANT

## 2022-04-15 NOTE — Anesthesia Preprocedure Evaluation (Addendum)
Anesthesia Evaluation  Patient identified by MRN, date of birth, ID band Patient awake    Reviewed: Allergy & Precautions, NPO status , Patient's Chart, lab work & pertinent test results  Airway Mallampati: II  TM Distance: >3 FB Neck ROM: Full    Dental  (+) Upper Dentures, Lower Dentures   Pulmonary former smoker (30 pack years),    Pulmonary exam normal        Cardiovascular hypertension, Pt. on medications + Peripheral Vascular Disease  Normal cardiovascular exam     Neuro/Psych Depression negative neurological ROS     GI/Hepatic negative GI ROS, Neg liver ROS,   Endo/Other  diabetes, Type 2BMI 33  Renal/GU negative Renal ROS     Musculoskeletal  (+) Arthritis , Osteoarthritis,    Abdominal Normal abdominal exam  (+)   Peds  Hematology   Anesthesia Other Findings   Reproductive/Obstetrics                            Anesthesia Physical Anesthesia Plan  ASA: 3  Anesthesia Plan: General   Post-op Pain Management: Fentanyl IV   Induction:   PONV Risk Score and Plan: 3 and Midazolam, Ondansetron, Dexamethasone and Treatment may vary due to age or medical condition  Airway Management Planned: Oral ETT  Additional Equipment:   Intra-op Plan:   Post-operative Plan: Extubation in OR  Informed Consent: I have reviewed the patients History and Physical, chart, labs and discussed the procedure including the risks, benefits and alternatives for the proposed anesthesia with the patient or authorized representative who has indicated his/her understanding and acceptance.     Dental advisory given  Plan Discussed with: CRNA  Anesthesia Plan Comments:        Anesthesia Quick Evaluation

## 2022-04-15 NOTE — Op Note (Signed)
..  04/15/2022  10:17 AM    Kendra Downs  [id number]   Pre-Op Dx:  Chronic Rhinosinusitis refractory to medical treatment, nasal obstruction, bilateral nasal polyposis.  Post-op Dx: Same  Proc:   1)  Image Guided Sinus Surgery,  2)  Bilateral Nasal Polypectomy  3)  Bilateral Balloon Maxillary Sinuplasty  Surg:  Kendra Downs  Anes:  General  EBL:  78m  Comp:  None  Findings: Large polyps filling nasal cavity emanating from superior septum and medial nasal room medial to middle turbinates bilaterally.  Mild polypoid degeneration of lateral aspect of middle turbinate bilaterally.  Successful maxillary balloon sinuplasty.  Procedure: After the patient was identified in holding and the benefits of the procedure were reviewed as well as the consent and risks.  The patient was taken to the operating room and with the patient in a comfortable supine position,  general orotracheal anesthesia was induced without difficulty.  A proper time-out was performed.  The Trudi image guidance system was set up and calibrated in the normal fashion.       The patient next received preoperative Afrin spray for topical decongestion and vasoconstriction and 1% Xylocaine with 1:100,000 epinephrine, 6 cc's, was infiltrated into the inferior turbinates, septum, and anterior middle turbinates bilaterally.  Several minutes were allowed for this to take effect.  Cottoniod pledgets soaked in Afrin were placed into both nasal cavities and left while the patient was prepped and draped in the standard fashion.  The materials were removed from the nose and observed to be intact and correct in number.  The nose was next inspected with a zero degree endoscope and the middle turbinates were medialized and afrin soaked pledgets were placed lateral to the turbinates for approximately one minute.  At this time attention was directed to the patient's maxillary sinuses.  On the left, a ball tipped probe was used to  medialize the uncinate process.  The balloon sinuplasty device was brought onto the field and using image guidance was placed through the natural os.  This was dilated x3.  This was repeated in a simlar fashion on the patient's right side.  Hemostasis was performed with topical Afrin soaked pledgets.  The patient's nasal cavity was examinated and large polyps fibrotic in nature were noted bilaterally.  These were traced to their insertion on superior aspect of septum and nasal roof medial to the middle turbinates.  These were gently grasped and removed from the patient's nasal cavities bilaterally.  Brisk bleeding occurred from area of insertion.   This was controlled with topical after and Arista bilaterally.  Meticulous hemostasis was continued and all sinuses were examined and noted to be widely patent.    At this time, Stamberger sinufoam was placed lateral to the middle turbiantes as well as medial to the middle turbinates at previous insertion sites of the polyps.  Xerogel was cut into 1/3s and placed also in these areas for good compression.  Stamberger was placed again on top of the Xerogel.  Visualization with zero degree scope revealed good hemostasis.  Care of the patient at this time was transferred to anesthesia.  Patient tolerated the procedure well.  Dispo:   PACU to home  Plan: Ice, elevation, narcotic analgesia and prophylactic antibiotics.  We will reevaluate the patient in the office in 7 days.  Return to work in 7-10 days, strenuous activities in two weeks.   Kendra Downs 04/15/2022 10:17 AM

## 2022-04-15 NOTE — Anesthesia Postprocedure Evaluation (Signed)
Anesthesia Post Note  Patient: Kendra Downs  Procedure(s) Performed: IMAGE GUIDED SINUS SURGERY POLYPECTOMY NASAL (Bilateral) MAXILLARY BALLOON SINUPLASTY (Bilateral)     Patient location during evaluation: PACU Anesthesia Type: General Level of consciousness: awake and alert Pain management: pain level controlled Vital Signs Assessment: post-procedure vital signs reviewed and stable Respiratory status: spontaneous breathing and nonlabored ventilation Cardiovascular status: blood pressure returned to baseline Postop Assessment: no apparent nausea or vomiting Anesthetic complications: no   No notable events documented.  Fredna Stricker [employer]

## 2022-04-15 NOTE — Transfer of Care (Signed)
Immediate Anesthesia Transfer of Care Note  Patient: Kendra Downs  Procedure(s) Performed: IMAGE GUIDED SINUS SURGERY POLYPECTOMY NASAL (Bilateral) MAXILLARY BALLOON SINUPLASTY (Bilateral)  Patient Location: PACU  Anesthesia Type: General  Level of Consciousness: awake, alert  and patient cooperative  Airway and Oxygen Therapy: Patient Spontanous Breathing and Patient connected to supplemental oxygen  Post-op Assessment: Post-op Vital signs reviewed, Patient's Cardiovascular Status Stable, Respiratory Function Stable, Patent Airway and No signs of Nausea or vomiting  Post-op Vital Signs: Reviewed and stable  Complications: No notable events documented.

## 2022-04-15 NOTE — H&P (Signed)
..  History and Physical paper copy reviewed and updated date of procedure and will be scanned into system.  Patient seen and examined.  

## 2022-04-15 NOTE — Anesthesia Procedure Notes (Signed)
Procedure Name: Intubation Date/Time: 04/15/2022 9:00 AM  Performed by: Jeannene Patella, CRNAPre-anesthesia Checklist: Patient identified, Emergency Drugs available, Suction available, Patient being monitored and Timeout performed Patient Re-evaluated:Patient Re-evaluated prior to induction Oxygen Delivery Method: Circle system utilized Preoxygenation: Pre-oxygenation with 100% oxygen Induction Type: IV induction Ventilation: Oral airway inserted - appropriate to patient size and Mask ventilation without difficulty Laryngoscope Size: Sabra Heck and 2 Grade View: Grade I Tube type: Oral Rae Number of attempts: 1 Airway Equipment and Method: Stylet Placement Confirmation: ETT inserted through vocal cords under direct vision, breath sounds checked- equal and bilateral and positive ETCO2 Tube secured with: Tape Dental Injury: Teeth and Oropharynx as per pre-operative assessment

## 2022-04-16 ENCOUNTER — Encounter: Payer: Self-pay | Admitting: Otolaryngology

## 2022-04-16 LAB — SURGICAL PATHOLOGY

## 2022-05-26 ENCOUNTER — Ambulatory Visit
Admission: RE | Admit: 2022-05-26 | Discharge: 2022-05-26 | Disposition: A | Discharge status: Home / Self Care | Payer: Medicare Other | Source: Ambulatory Visit | Attending: Family Medicine | Admitting: Family Medicine

## 2022-05-26 DIAGNOSIS — Z1231 Encounter for screening mammogram for malignant neoplasm of breast: Secondary | ICD-10-CM | POA: Insufficient documentation

## 2022-10-06 ENCOUNTER — Encounter: Payer: Self-pay | Admitting: Dermatology

## 2022-10-24 ENCOUNTER — Ambulatory Visit
Admission: EM | Admit: 2022-10-24 | Discharge: 2022-10-24 | Disposition: A | Discharge status: Home / Self Care | Payer: Medicare Other | Attending: Urgent Care | Admitting: Urgent Care

## 2022-10-24 DIAGNOSIS — U071 COVID-19: Secondary | ICD-10-CM | POA: Diagnosis not present

## 2022-10-24 DIAGNOSIS — R6883 Chills (without fever): Secondary | ICD-10-CM | POA: Diagnosis not present

## 2022-10-24 DIAGNOSIS — M791 Myalgia, unspecified site: Secondary | ICD-10-CM

## 2022-10-24 LAB — POCT RAPID STREP A (OFFICE): Rapid Strep A Screen: NEGATIVE

## 2022-10-24 LAB — POCT INFLUENZA A/B
Influenza A, POC: NEGATIVE
Influenza B, POC: NEGATIVE

## 2022-10-24 NOTE — ED Triage Notes (Signed)
Pt. Presents to UC w/ c/o body aches and chills that started today.

## 2022-10-24 NOTE — Discharge Instructions (Signed)
Follow up here or with your primary care provider if your symptoms are worsening or not improving.     

## 2022-10-24 NOTE — ED Provider Notes (Signed)
[facility]    CSN: [id number] Arrival date & time: 10/24/22  1138      History   Chief Complaint No chief complaint on file.   HPI Kendra Downs is a 76 y.o. female.   HPI  Patient presents to urgent care with flu-like symptoms since this morning. she endorses body aches, chills.  Denies cough.  Denies sore throat.  Denies nausea, vomiting, diarrhea.  Denies measured fever.  Denies nasal congestion or sinus pressure.  Past Medical History:  Diagnosis Date   Arthritis    Cancer ([place])    right arm - skin melanoma   Depression    with loss of son   Diabetes mellitus without complication ([place])    prediabetic   Hyperlipidemia 2014   Hypertension    Pain    right knee   Pre-diabetes    PVD (peripheral vascular disease) ([place])    Wears dentures    full upper and lower    Patient Active Problem List   Diagnosis Date Noted   S/P total knee arthroplasty 11/09/2016   Diabetes type 2, controlled ([place]) 08/12/2015   Arthritis of knee, degenerative 05/18/2014    Past Surgical History:  Procedure Laterality Date   BALLOON SINUPLASTY Bilateral 04/15/2022   Procedure: MAXILLARY BALLOON SINUPLASTY;  Surgeon: Carloyn Manner, MD;  Location: [facility];  Service: ENT;  Laterality: Bilateral;   BREAST CYST ASPIRATION Right    cyst   COLONOSCOPY WITH PROPOFOL N/A 03/25/2020   Procedure: COLONOSCOPY WITH PROPOFOL;  Surgeon: Toledo, Benay Pike, MD;  Location: [facility];  Service: Gastroenterology;  Laterality: N/A;   FRACTURE SURGERY     age 42 yrs   IMAGE GUIDED SINUS SURGERY N/A 04/15/2022   Procedure: IMAGE GUIDED SINUS SURGERY;  Surgeon: Carloyn Manner, MD;  Location: [facility];  Service: ENT;  Laterality: N/A;  need stryker disk PLACED DISK ON OR CHARGE NURSE DESK 4-18 KP   JOINT REPLACEMENT Right 11/09/2016   KNEE ARTHROPLASTY Right 11/09/2016   Procedure: COMPUTER ASSISTED TOTAL KNEE ARTHROPLASTY;  Surgeon: Dereck Leep, MD;   Location: [facility];  Service: Orthopedics;  Laterality: Right;   KNEE ARTHROPLASTY Left 09/06/2017   Procedure: COMPUTER ASSISTED TOTAL KNEE ARTHROPLASTY;  Surgeon: Dereck Leep, MD;  Location: [facility];  Service: Orthopedics;  Laterality: Left;   LASER ABLATION  october 2013   venous ulcer   POLYPECTOMY Bilateral 04/15/2022   Procedure: POLYPECTOMY NASAL;  Surgeon: Carloyn Manner, MD;  Location: [facility];  Service: ENT;  Laterality: Bilateral;   SKIN CANCER EXCISION      OB History   No obstetric history on file.      Home Medications    Prior to Admission medications   Medication Sig Start Date End Date Taking? Authorizing Provider  doxycycline (MONODOX) 100 MG capsule Take 1 capsule (100 mg total) by mouth 2 (two) times daily. 04/15/22   Vaught, Jeannie Fend, MD  glucose blood (FREESTYLE LITE) test strip USE 1 STRIP VIA METER ONCE A DAY AS DIRECTED 02/02/18   [provider]  hydrochlorothiazide (MICROZIDE) 12.5 MG capsule Take 25 mg by mouth daily.    [provider]  HYDROcodone-acetaminophen (NORCO/VICODIN) 5-325 MG tablet Take 1 tablet by mouth every 4 (four) hours as needed for moderate pain. 04/15/22 04/15/23  Carloyn Manner, MD  lisinopril (ZESTRIL) 40 MG tablet Take 40 mg by mouth daily.    [provider]  [place]    [provider]  NUTRITIONAL SUPPLEMENT LIQD Take 1 scoop by mouth daily. Isagenix shake powder multivitamin    [provider]  Omega-3 Fatty Acids (OMEGA-3 FISH OIL PO) Take by mouth daily.    [provider]  ondansetron (ZOFRAN) 4 MG tablet Take 1 tablet (4 mg total) by mouth every 8 (eight) hours as needed for up to 20 doses for nausea or vomiting. 04/15/22   Vaught, Jeannie Fend, MD  predniSONE (STERAPRED UNI-PAK 21 TAB) 10 MG (21) TBPK tablet Sterapred DS 6 day taper 04/15/22   Carloyn Manner, MD  rosuvastatin (CRESTOR) 5 MG tablet Take by mouth. 07/06/18 04/15/22   [provider]  Vitamin D, Ergocalciferol, (DRISDOL) 50000 units CAPS capsule Take 50,000 Units by mouth every 7 (seven) days. Either Wednesday or Thursday    [provider]    Family History Family History  Problem Relation Age of Onset   Breast cancer Maternal Aunt 58   Hyperlipidemia Sister    Hypertension Sister    Diabetes Sister    Cancer Son    Diabetes Sister    Diabetes Brother     Social History Social History   Tobacco Use   Smoking status: Former    Types: Cigarettes    Quit date: 08/12/1987    Years since quitting: 35.2   Smokeless tobacco: Never  Vaping Use   Vaping Use: Never used  Substance Use Topics   Alcohol use: Not Currently    Comment: rare   Drug use: No     Allergies   Patient has no known allergies.   Review of Systems Review of Systems   Physical Exam Triage Vital Signs ED Triage Vitals  Enc Vitals Group     BP      Pulse      Resp      Temp      Temp src      SpO2      Weight      Height      Head Circumference      Peak Flow      Pain Score      Pain Loc      Pain Edu?      Excl. in [place]?    No data found.  Updated Vital Signs There were no vitals taken for this visit.  Visual Acuity Right Eye Distance:   Left Eye Distance:   Bilateral Distance:    Right Eye Near:   Left Eye Near:    Bilateral Near:     Physical Exam Constitutional:      Appearance: Normal appearance.  Cardiovascular:     Rate and Rhythm: Normal rate and regular rhythm.     Pulses: Normal pulses.     Heart sounds: Normal heart sounds.  Pulmonary:     Effort: Pulmonary effort is normal.     Breath sounds: Normal breath sounds.  Skin:    General: Skin is warm and dry.  Neurological:     General: No focal deficit present.     Mental Status: She is alert and oriented to person, place, and time.  Psychiatric:        Mood and Affect: Mood normal.        Behavior: Behavior normal.      UC Treatments / Results   Labs (all labs ordered are listed, but only abnormal results are displayed) Labs Reviewed - No data to display  EKG   Radiology No results found.  Procedures  Procedures (including critical care time)  Medications Ordered in UC Medications - No data to display  Initial Impression / Assessment and Plan / UC Course  I have reviewed the triage vital signs and the nursing notes.  Pertinent labs & imaging results that were available during my care of the patient were reviewed by me and considered in my medical decision making (see chart for details).   Patient is afebrile here without recent antipyretics. Satting well on room air. Overall is well appearing, well hydrated, without respiratory distress. Pulmonary exam is unremarkable.  Lungs CTAB without wheezing, rhonchi, rales.  Throat is not erythematous.  There are several white lesions on the left side of her tonsils that appear to be small stones, approximately 1 to 2 mm.  Rapid strep is negative.  She has no additional symptoms and so testing for both influenza and COVID given her age and risk of poor outcome.  She would be eligible for both influenza and COVID antiviral treatments if positive.  POCT influenza is negative for influenza A/B.  Awaiting results of COVID.  Recommending use of Tylenol to control chills and bodyaches.  Recent GFR greater than 92 so if tolerated, she could also use ibuprofen if Tylenol is not effective at controlling symptoms.  Final Clinical Impressions(s) / UC Diagnoses   Final diagnoses:  None   Discharge Instructions   None    ED Prescriptions   None    PDMP not reviewed this encounter.   Rose Phi, Tensed 10/24/22 1458

## 2022-10-25 LAB — SARS CORONAVIRUS 2 (TAT 6-24 HRS): SARS Coronavirus 2: POSITIVE — AB

## 2022-10-27 ENCOUNTER — Telehealth: Payer: Medicare Other | Admitting: Family Medicine

## 2022-10-27 ENCOUNTER — Telehealth: Payer: Medicare Other | Admitting: Physician Assistant

## 2022-10-27 DIAGNOSIS — U071 COVID-19: Secondary | ICD-10-CM

## 2022-10-27 MED ORDER — PREDNISONE 20 MG PO TABS: 40.0000 mg | ORAL_TABLET | Freq: Every day | ORAL | 0 refills | Status: AC

## 2022-10-27 MED ORDER — MOLNUPIRAVIR EUA 200MG CAPSULE: 4.0000 | ORAL_CAPSULE | Freq: Two times a day (BID) | ORAL | 0 refills | Status: AC

## 2022-10-27 NOTE — Progress Notes (Signed)
   Thank you for the details you included in the comment boxes. Those details are very helpful in determining the best course of treatment for you and help [place] to provide the best care.Because you are COVID positive and giving medical history and risks, we recommend that you convert this visit to a video visit in order for the provider to better assess what is going on and to discuss antiviral medications, etc.  The provider will be able to give you a more accurate diagnosis and treatment plan if we can more freely discuss your symptoms and with the addition of a virtual examination.   If you convert to a video visit, we will bill your insurance (similar to an office visit) and you will not be charged for this e-Visit. You will be able to stay at home and speak with the first available [facility] advanced practice provider. The link to do a video visit is in the drop down Menu tab of your Welcome screen in [place].

## 2022-10-27 NOTE — Progress Notes (Signed)
Virtual Visit Consent   Kendra Downs, you are scheduled for a virtual visit with a [facility] provider today. Just as with appointments in the office, your consent must be obtained to participate. Your consent will be active for this visit and any virtual visit you may have with one of our providers in the next 365 days. If you have a MyChart account, a copy of this consent can be sent to you electronically.  As this is a virtual visit, video technology does not allow for your provider to perform a traditional examination. This may limit your provider's ability to fully assess your condition. If your provider identifies any concerns that need to be evaluated in person or the need to arrange testing (such as labs, EKG, etc.), we will make arrangements to do so. Although advances in technology are sophisticated, we cannot ensure that it will always work on either your end or our end. If the connection with a video visit is poor, the visit may have to be switched to a telephone visit. With either a video or telephone visit, we are not always able to ensure that we have a secure connection.  By engaging in this virtual visit, you consent to the provision of healthcare and authorize for your insurance to be billed (if applicable) for the services provided during this visit. Depending on your insurance coverage, you may receive a charge related to this service.  I need to obtain your verbal consent now. Are you willing to proceed with your visit today? Kendra Downs has provided verbal consent on 10/27/2022 for a virtual visit (video or telephone). Perlie Mayo, NP  Date: 10/27/2022 11:47 AM  Virtual Visit via Video Note   I, Perlie Mayo, connected with  Kendra Downs  ([id number], 19-Apr-1946, 76) on 10/27/22 at 11:45 AM EST by a video-enabled telemedicine application and verified that I am speaking with the correct person using two identifiers.  Location: Patient: Virtual Visit Location  Patient: Home Provider: Virtual Visit Location Provider: Home Office   I discussed the limitations of evaluation and management by telemedicine and the availability of in person appointments. The patient expressed understanding and agreed to proceed.    History of Present Illness: Kendra Downs is a 76 y.o. who identifies as a female who was assigned female at birth, and is being seen today for COVID + Started with chills and body aches on Saturday morning 2am 10/24/22 Went to work that day- felt achy took tylenol- then she noticed she was not getting better. Went to walk in clinic- and then tested for COVID, strep, and Flu, RSV. Has some cough- but spasms. Some congestion and runny nose. Found out that her covid test came back + yesterday morning.  Has been in touch with health at work- out until 12/28 as of now. Feels restless, but low energy.  Denies chest pain, shortness of breath, sore/ ear pain, fevers and chills.   Problems:  Patient Active Problem List   Diagnosis Date Noted   S/P total knee arthroplasty 11/09/2016   Diabetes type 2, controlled ([place]) 08/12/2015   Arthritis of knee, degenerative 05/18/2014    Allergies: No Known Allergies Medications:  Current Outpatient Medications:    doxycycline (MONODOX) 100 MG capsule, Take 1 capsule (100 mg total) by mouth 2 (two) times daily., Disp: 20 capsule, Rfl: 0   glucose blood (FREESTYLE LITE) test strip, USE 1 STRIP VIA METER ONCE A DAY AS DIRECTED, Disp: , Rfl:  hydrochlorothiazide (MICROZIDE) 12.5 MG capsule, Take 25 mg by mouth daily., Disp: , Rfl:    HYDROcodone-acetaminophen (NORCO/VICODIN) 5-325 MG tablet, Take 1 tablet by mouth every 4 (four) hours as needed for moderate pain., Disp: 20 tablet, Rfl: 0   lisinopril (ZESTRIL) 40 MG tablet, Take 40 mg by mouth daily., Disp: , Rfl:    MICROLET LANCETS MISC, Microlet Lancet, Disp: , Rfl:    NUTRITIONAL SUPPLEMENT LIQD, Take 1 scoop by mouth daily. Isagenix shake powder  multivitamin, Disp: , Rfl:    Omega-3 Fatty Acids (OMEGA-3 FISH OIL PO), Take by mouth daily., Disp: , Rfl:    ondansetron (ZOFRAN) 4 MG tablet, Take 1 tablet (4 mg total) by mouth every 8 (eight) hours as needed for up to 20 doses for nausea or vomiting., Disp: 20 tablet, Rfl: 0   predniSONE (STERAPRED UNI-PAK 21 TAB) 10 MG (21) TBPK tablet, Sterapred DS 6 day taper, Disp: 21 tablet, Rfl: 0   rosuvastatin (CRESTOR) 5 MG tablet, Take by mouth., Disp: , Rfl:    Vitamin D, Ergocalciferol, (DRISDOL) 50000 units CAPS capsule, Take 50,000 Units by mouth every 7 (seven) days. Either Wednesday or Thursday, Disp: , Rfl:   Observations/Objective: Patient is well-developed, well-nourished in no acute distress.  Resting comfortably  at home.  Head is normocephalic, atraumatic.  No labored breathing.  Speech is clear and coherent with logical content.  Patient is alert and oriented at baseline.  Cough Nasal congestion/tone  Assessment and Plan:  1. COVID-19  - molnupiravir EUA (LAGEVRIO) 200 mg CAPS capsule; Take 4 capsules (800 mg total) by mouth 2 (two) times daily for 5 days.  Dispense: 40 capsule; Refill: 0 - predniSONE (DELTASONE) 20 MG tablet; Take 2 tablets (40 mg total) by mouth daily with breakfast for 5 days.  Dispense: 10 tablet; Refill: 0  -Take meds as prescribed -Rest -Use a cool mist humidifier especially during the winter months when heat dries out the air. - Use saline nose sprays frequently to help soothe nasal passages and promote drainage. -Saline irrigations of the nose can be very helpful if done frequently.             * 4X daily for 1 week*             * Use of a nettie pot can be helpful with this.  *Follow directions with this* *Boiled or distilled water only -stay hydrated by drinking plenty of fluids - Keep thermostat turn down low to prevent drying out sinuses - For any cough or congestion- robitussin DM or Delsym as needed - For fever or aches or pains- take  tylenol or ibuprofen as directed on bottle             * for fevers greater than 101 orally you may alternate ibuprofen and tylenol every 3 hours.  If you do not improve you will need a follow up visit in person.               - Continue OTC symptomatic management of choice - Will send OTC vitamins and supplement information through AVS - Take meds as  prescribed - Patient enrolled in MyChart symptom monitoring - Push fluids - Rest as needed - Discussed return precautions and when to seek in-person evaluation, sent via AVS as well   Reviewed side effects, risks and benefits of medication.    Patient acknowledged agreement and understanding of the plan.   Past Medical, Surgical, Social History, Allergies, and Medications have been  Reviewed.    Follow Up Instructions: I discussed the assessment and treatment plan with the patient. The patient was provided an opportunity to ask questions and all were answered. The patient agreed with the plan and demonstrated an understanding of the instructions.  A copy of instructions were sent to the patient via MyChart unless otherwise noted below.     The patient was advised to call back or seek an in-person evaluation if the symptoms worsen or if the condition fails to improve as anticipated.  Time:  I spent 10 minutes with the patient via telehealth technology discussing the above problems/concerns.    Perlie Mayo, NP

## 2022-10-27 NOTE — Patient Instructions (Signed)
Kendra Downs, thank you for joining Perlie Mayo, NP for today's virtual visit.  While this provider is not your primary care provider (PCP), if your PCP is located in our provider database this encounter information will be shared with them immediately following your visit.   [facility] account gives you access to today's visit and all your visits, tests, and labs performed at [facility] " click here if you don't have a [facility] account or go to [email or web address]  Consent: (Patient) Kendra Downs provided verbal consent for this virtual visit at the beginning of the encounter.  Current Medications:  Current Outpatient Medications:    molnupiravir EUA (LAGEVRIO) 200 mg CAPS capsule, Take 4 capsules (800 mg total) by mouth 2 (two) times daily for 5 days., Disp: 40 capsule, Rfl: 0   predniSONE (DELTASONE) 20 MG tablet, Take 2 tablets (40 mg total) by mouth daily with breakfast for 5 days., Disp: 10 tablet, Rfl: 0   doxycycline (MONODOX) 100 MG capsule, Take 1 capsule (100 mg total) by mouth 2 (two) times daily., Disp: 20 capsule, Rfl: 0   glucose blood (FREESTYLE LITE) test strip, USE 1 STRIP VIA METER ONCE A DAY AS DIRECTED, Disp: , Rfl:    hydrochlorothiazide (MICROZIDE) 12.5 MG capsule, Take 25 mg by mouth daily., Disp: , Rfl:    HYDROcodone-acetaminophen (NORCO/VICODIN) 5-325 MG tablet, Take 1 tablet by mouth every 4 (four) hours as needed for moderate pain., Disp: 20 tablet, Rfl: 0   lisinopril (ZESTRIL) 40 MG tablet, Take 40 mg by mouth daily., Disp: , Rfl:    MICROLET LANCETS MISC, Microlet Lancet, Disp: , Rfl:    NUTRITIONAL SUPPLEMENT LIQD, Take 1 scoop by mouth daily. Isagenix shake powder multivitamin, Disp: , Rfl:    Omega-3 Fatty Acids (OMEGA-3 FISH OIL PO), Take by mouth daily., Disp: , Rfl:    ondansetron (ZOFRAN) 4 MG tablet, Take 1 tablet (4 mg total) by mouth every 8 (eight) hours as needed for up to 20 doses for nausea or  vomiting., Disp: 20 tablet, Rfl: 0   rosuvastatin (CRESTOR) 5 MG tablet, Take by mouth., Disp: , Rfl:    Vitamin D, Ergocalciferol, (DRISDOL) 50000 units CAPS capsule, Take 50,000 Units by mouth every 7 (seven) days. Either Wednesday or Thursday, Disp: , Rfl:    Medications ordered in this encounter:  Meds ordered this encounter  Medications   molnupiravir EUA (LAGEVRIO) 200 mg CAPS capsule    Sig: Take 4 capsules (800 mg total) by mouth 2 (two) times daily for 5 days.    Dispense:  40 capsule    Refill:  0    Order Specific Question:   Supervising Provider    Answer:   Chase Picket [[id number]]   predniSONE (DELTASONE) 20 MG tablet    Sig: Take 2 tablets (40 mg total) by mouth daily with breakfast for 5 days.    Dispense:  10 tablet    Refill:  0    Order Specific Question:   Supervising Provider    Answer:   Chase Picket [id number]     *If you need refills on other medications prior to your next appointment, please contact your pharmacy*  Follow-Up: Call back or seek an in-person evaluation if the symptoms worsen or if the condition fails to improve as anticipated.  [facility] [phone]  Other Instructions  Please keep well-hydrated and get plenty of rest. Start a saline nasal rinse to flush  out your nasal passages. You can use plain Mucinex to help thin congestion. If you have a humidifier, running in the bedroom at night. I want you to start OTC vitamin D3 1000 units daily, vitamin C 1000 mg daily, and a zinc supplement. Please take prescribed medications as directed.  You have been enrolled in a MyChart symptom monitoring program. Please answer these questions daily so we can keep track of how you are doing.  You were to quarantine for 5 days from onset of your symptoms.  After day 5, if you have had no fever and you are feeling better, you can end quarantine but need to mask for an additional 5 days. After day 5 if you have a fever or are having  significant symptoms, please quarantine for full 10 days.  If you note any worsening of symptoms, any significant shortness of breath or any chest pain, please seek ER evaluation ASAP.  Please do not delay care!  COVID-19: What to Do if You Are Sick If you test positive and are an older adult or someone who is at high risk of getting very sick from COVID-19, treatment may be available. Contact a healthcare provider right away after a positive test to determine if you are eligible, even if your symptoms are mild right now. You can also visit a Test to Treat location and, if eligible, receive a prescription from a provider. Don't delay: Treatment must be started within the first few days to be effective. If you have a fever, cough, or other symptoms, you might have COVID-19. Most people have mild illness and are able to recover at home. If you are sick: Keep track of your symptoms. If you have an emergency warning sign (including trouble breathing), call 911. Steps to help prevent the spread of COVID-19 if you are sick If you are sick with COVID-19 or think you might have COVID-19, follow the steps below to care for yourself and to help protect other people in your home and community. Stay home except to get medical care Stay home. Most people with COVID-19 have mild illness and can recover at home without medical care. Do not leave your home, except to get medical care. Do not visit public areas and do not go to places where you are unable to wear a mask. Take care of yourself. Get rest and stay hydrated. Take over-the-counter medicines, such as acetaminophen, to help you feel better. Stay in touch with your doctor. Call before you get medical care. Be sure to get care if you have trouble breathing, or have any other emergency warning signs, or if you think it is an emergency. Avoid public transportation, ride-sharing, or taxis if possible. Get tested If you have symptoms of COVID-19, get tested.  While waiting for test results, stay away from others, including staying apart from those living in your household. Get tested as soon as possible after your symptoms start. Treatments may be available for people with COVID-19 who are at risk for becoming very sick. Don't delay: Treatment must be started early to be effective--some treatments must begin within 5 days of your first symptoms. Contact your healthcare provider right away if your test result is positive to determine if you are eligible. Self-tests are one of several options for testing for the virus that causes COVID-19 and may be more convenient than laboratory-based tests and point-of-care tests. Ask your healthcare provider or your local health department if you need help interpreting your test results. You can  visit your state, tribal, local, and territorial health department's website to look for the latest local information on testing sites. Separate yourself from other people As much as possible, stay in a specific room and away from other people and pets in your home. If possible, you should use a separate bathroom. If you need to be around other people or animals in or outside of the home, wear a well-fitting mask. Tell your close contacts that they may have been exposed to COVID-19. An infected person can spread COVID-19 starting 48 hours (or 2 days) before the person has any symptoms or tests positive. By letting your close contacts know they may have been exposed to COVID-19, you are helping to protect everyone. See COVID-19 and Animals if you have questions about pets. If you are diagnosed with COVID-19, someone from the health department may call you. Answer the call to slow the spread. Monitor your symptoms Symptoms of COVID-19 include fever, cough, or other symptoms. Follow care instructions from your healthcare provider and local health department. Your local health authorities may give instructions on checking your symptoms  and reporting information. When to seek emergency medical attention Look for emergency warning signs* for COVID-19. If someone is showing any of these signs, seek emergency medical care immediately: Trouble breathing Persistent pain or pressure in the chest New confusion Inability to wake or stay awake Pale, gray, or blue-colored skin, lips, or nail beds, depending on skin tone *This list is not all possible symptoms. Please call your medical provider for any other symptoms that are severe or concerning to you. Call 911 or call ahead to your local emergency facility: Notify the operator that you are seeking care for someone who has or may have COVID-19. Call ahead before visiting your doctor Call ahead. Many medical visits for routine care are being postponed or done by phone or telemedicine. If you have a medical appointment that cannot be postponed, call your doctor's office, and tell them you have or may have COVID-19. This will help the office protect themselves and other patients. If you are sick, wear a well-fitting mask You should wear a mask if you must be around other people or animals, including pets (even at home). Wear a mask with the best fit, protection, and comfort for you. You don't need to wear the mask if you are alone. If you can't put on a mask (because of trouble breathing, for example), cover your coughs and sneezes in some other way. Try to stay at least 6 feet away from other people. This will help protect the people around you. Masks should not be placed on young children under age 88 years, anyone who has trouble breathing, or anyone who is not able to remove the mask without help. Cover your coughs and sneezes Cover your mouth and nose with a tissue when you cough or sneeze. Throw away used tissues in a lined trash can. Immediately wash your hands with soap and water for at least 20 seconds. If soap and water are not available, clean your hands with an alcohol-based  hand sanitizer that contains at least 60% alcohol. Clean your hands often Wash your hands often with soap and water for at least 20 seconds. This is especially important after blowing your nose, coughing, or sneezing; going to the bathroom; and before eating or preparing food. Use hand sanitizer if soap and water are not available. Use an alcohol-based hand sanitizer with at least 60% alcohol, covering all surfaces of your hands  and rubbing them together until they feel dry. Soap and water are the best option, especially if hands are visibly dirty. Avoid touching your eyes, nose, and mouth with unwashed hands. Handwashing Tips Avoid sharing personal household items Do not share dishes, drinking glasses, cups, eating utensils, towels, or bedding with other people in your home. Wash these items thoroughly after using them with soap and water or put in the dishwasher. Clean surfaces in your home regularly Clean and disinfect high-touch surfaces (for example, doorknobs, tables, handles, light switches, and countertops) in your "sick room" and bathroom. In shared spaces, you should clean and disinfect surfaces and items after each use by the person who is ill. If you are sick and cannot clean, a caregiver or other person should only clean and disinfect the area around you (such as your bedroom and bathroom) on an as needed basis. Your caregiver/other person should wait as long as possible (at least several hours) and wear a mask before entering, cleaning, and disinfecting shared spaces that you use. Clean and disinfect areas that may have blood, stool, or body fluids on them. Use household cleaners and disinfectants. Clean visible dirty surfaces with household cleaners containing soap or detergent. Then, use a household disinfectant. Use a product from [employer] List N: Disinfectants for Coronavirus (FYBOF-75). Be sure to follow the instructions on the label to ensure safe and effective use of the product.  Many products recommend keeping the surface wet with a disinfectant for a certain period of time (look at "contact time" on the product label). You may also need to wear personal protective equipment, such as gloves, depending on the directions on the product label. Immediately after disinfecting, wash your hands with soap and water for 20 seconds. For completed guidance on cleaning and disinfecting your home, visit Complete Disinfection Guidance. Take steps to improve ventilation at home Improve ventilation (air flow) at home to help prevent from spreading COVID-19 to other people in your household. Clear out COVID-19 virus particles in the air by opening windows, using air filters, and turning on fans in your home. Use this interactive tool to learn how to improve air flow in your home. When you can be around others after being sick with COVID-19 Deciding when you can be around others is different for different situations. Find out when you can safely end home isolation. For any additional questions about your care, contact your healthcare provider or state or local health department. 01/21/2021 Content source: [facility] for Immunization and Respiratory Diseases ([facility]), Division of Viral Diseases This information is not intended to replace advice given to you by your health care provider. Make sure you discuss any questions you have with your health care provider. Document Revised: 03/06/2021 Document Reviewed: 03/06/2021 Elsevier Patient Education  2022 [employer].      If you have been instructed to have an in-person evaluation today at a local Urgent Care facility, please use the link below. It will take you to a list of all of our available [facility], including address, phone number and hours of operation. Please do not delay care.  [facility]  If you or a family member do not have a primary care provider, use the link below to schedule a visit and  establish care. When you choose a [facility] primary care physician or advanced practice provider, you gain a long-term partner in health. Find a Primary Care Provider  Learn more about 's in-office and virtual care  options: [facility]

## 2023-06-25 ENCOUNTER — Other Ambulatory Visit: Payer: Self-pay | Admitting: Family Medicine

## 2023-06-25 DIAGNOSIS — Z1231 Encounter for screening mammogram for malignant neoplasm of breast: Secondary | ICD-10-CM

## 2023-07-27 ENCOUNTER — Ambulatory Visit
Admission: RE | Admit: 2023-07-27 | Discharge: 2023-07-27 | Disposition: A | Discharge status: Home / Self Care | Payer: Medicare Other | Source: Ambulatory Visit | Attending: Family Medicine | Admitting: Family Medicine

## 2023-07-27 DIAGNOSIS — Z1231 Encounter for screening mammogram for malignant neoplasm of breast: Secondary | ICD-10-CM | POA: Diagnosis present

## 2024-09-11 ENCOUNTER — Other Ambulatory Visit: Payer: Self-pay | Admitting: Family Medicine

## 2024-09-11 DIAGNOSIS — Z1231 Encounter for screening mammogram for malignant neoplasm of breast: Secondary | ICD-10-CM

## 2024-10-17 ENCOUNTER — Encounter

## 2024-10-23 ENCOUNTER — Ambulatory Visit
Admission: RE | Admit: 2024-10-23 | Discharge: 2024-10-23 | Disposition: A | Discharge status: Home / Self Care | Source: Ambulatory Visit | Attending: Family Medicine | Admitting: Family Medicine

## 2024-10-23 DIAGNOSIS — Z1231 Encounter for screening mammogram for malignant neoplasm of breast: Secondary | ICD-10-CM | POA: Insufficient documentation
